# Patient Record
Sex: Female | Born: 1975 | Race: White | Hispanic: No | Smoking: Never smoker
Health system: Southern US, Community
[De-identification: ages and names within clinical notes are randomized; demographics above are authoritative.]

## PROBLEM LIST (undated history)

## (undated) DIAGNOSIS — F329 Major depressive disorder, single episode, unspecified: Secondary | ICD-10-CM

## (undated) DIAGNOSIS — K219 Gastro-esophageal reflux disease without esophagitis: Secondary | ICD-10-CM

## (undated) DIAGNOSIS — F319 Bipolar disorder, unspecified: Secondary | ICD-10-CM

## (undated) DIAGNOSIS — E669 Obesity, unspecified: Secondary | ICD-10-CM

## (undated) DIAGNOSIS — F909 Attention-deficit hyperactivity disorder, unspecified type: Secondary | ICD-10-CM

## (undated) DIAGNOSIS — F32A Depression, unspecified: Secondary | ICD-10-CM

## (undated) DIAGNOSIS — F419 Anxiety disorder, unspecified: Secondary | ICD-10-CM

## (undated) DIAGNOSIS — IMO0001 Reserved for inherently not codable concepts without codable children: Secondary | ICD-10-CM

## (undated) DIAGNOSIS — E079 Disorder of thyroid, unspecified: Secondary | ICD-10-CM

## (undated) HISTORY — DX: Obesity, unspecified: E66.9

## (undated) HISTORY — DX: Depression, unspecified: F32.A

## (undated) HISTORY — DX: Gastro-esophageal reflux disease without esophagitis: K21.9

## (undated) HISTORY — DX: Reserved for inherently not codable concepts without codable children: IMO0001

## (undated) HISTORY — PX: THUMB ARTHROSCOPY: SHX2509

## (undated) HISTORY — DX: Bipolar disorder, unspecified: F31.9

## (undated) HISTORY — DX: Major depressive disorder, single episode, unspecified: F32.9

## (undated) HISTORY — DX: Anxiety disorder, unspecified: F41.9

---

## 2001-09-03 ENCOUNTER — Encounter: Payer: Self-pay | Admitting: Emergency Medicine

## 2001-09-03 ENCOUNTER — Emergency Department (HOSPITAL_COMMUNITY): Admission: EM | Admit: 2001-09-03 | Discharge: 2001-09-03 | Payer: Self-pay | Admitting: Plastic Surgery

## 2001-09-18 ENCOUNTER — Other Ambulatory Visit: Admission: RE | Admit: 2001-09-18 | Discharge: 2001-09-18 | Payer: Self-pay | Admitting: Obstetrics & Gynecology

## 2001-10-29 ENCOUNTER — Emergency Department (HOSPITAL_COMMUNITY): Admission: EM | Admit: 2001-10-29 | Discharge: 2001-10-29 | Payer: Self-pay | Admitting: Emergency Medicine

## 2001-10-29 ENCOUNTER — Encounter: Payer: Self-pay | Admitting: Emergency Medicine

## 2003-08-08 ENCOUNTER — Other Ambulatory Visit: Admission: RE | Admit: 2003-08-08 | Discharge: 2003-08-08 | Payer: Self-pay | Admitting: Obstetrics & Gynecology

## 2004-04-21 ENCOUNTER — Emergency Department (HOSPITAL_COMMUNITY): Admission: EM | Admit: 2004-04-21 | Discharge: 2004-04-21 | Payer: Self-pay | Admitting: Family Medicine

## 2004-09-04 ENCOUNTER — Emergency Department (HOSPITAL_COMMUNITY): Admission: EM | Admit: 2004-09-04 | Discharge: 2004-09-04 | Payer: Self-pay | Admitting: Emergency Medicine

## 2004-11-23 ENCOUNTER — Emergency Department (HOSPITAL_COMMUNITY): Admission: EM | Admit: 2004-11-23 | Discharge: 2004-11-23 | Payer: Self-pay | Admitting: Emergency Medicine

## 2005-10-24 HISTORY — PX: LIGAMENT REPAIR: SHX5444

## 2006-04-13 ENCOUNTER — Emergency Department (HOSPITAL_COMMUNITY): Admission: EM | Admit: 2006-04-13 | Discharge: 2006-04-13 | Payer: Self-pay | Admitting: *Deleted

## 2006-09-06 ENCOUNTER — Ambulatory Visit (HOSPITAL_BASED_OUTPATIENT_CLINIC_OR_DEPARTMENT_OTHER): Admission: RE | Admit: 2006-09-06 | Discharge: 2006-09-06 | Payer: Self-pay | Admitting: Orthopedic Surgery

## 2007-11-26 ENCOUNTER — Emergency Department (HOSPITAL_COMMUNITY): Admission: EM | Admit: 2007-11-26 | Discharge: 2007-11-26 | Payer: Self-pay | Admitting: Family Medicine

## 2009-09-19 ENCOUNTER — Observation Stay (HOSPITAL_COMMUNITY): Admission: EM | Admit: 2009-09-19 | Discharge: 2009-09-19 | Payer: Self-pay | Admitting: Emergency Medicine

## 2009-12-18 ENCOUNTER — Emergency Department (HOSPITAL_COMMUNITY): Admission: EM | Admit: 2009-12-18 | Discharge: 2009-12-18 | Payer: Self-pay | Admitting: Emergency Medicine

## 2011-03-11 NOTE — Op Note (Signed)
Tammie Rodriguez, Tammie Rodriguez               ACCOUNT NO.:  [id number]   MEDICAL RECORD NO.:  [id number]          PATIENT TYPE:  AMB   LOCATION:  DSC                          FACILITY:  [facility]   PHYSICIAN:  Cindee Salt, M.D.       DATE OF BIRTH:  1976-03-15   DATE OF PROCEDURE:  09/06/2006  DATE OF DISCHARGE:                                 OPERATIVE REPORT   PREOPERATIVE DIAGNOSIS:  Tear ulnar collateral ligament, metacarpophalangeal  joint, left thumb.   POSTOPERATIVE DIAGNOSIS:  Tear ulnar collateral ligament,  metacarpophalangeal joint, left thumb.   OPERATION:  Arthroscopy, synovectomy, shrinkage ulnar collateral ligament,  metacarpophalangeal joint, left thumb.   SURGEON:  Cindee Salt, M.D.   ASSISTANT:  Carolyne Fiscal R.N.   ANESTHESIA:  General.   HISTORY:  The patient is a 35 year old female with a history of sprain of  the ulnar collateral ligament, metacarpophalangeal joint, left thumb which  has not responded to conservative treatment.  She is admitted now for  arthroscopy, possible open repair, ulnar collateral ligament,  metacarpophalangeal joint left thumb.  She is aware of risks and  complications including infection, recurrence, incomplete relief of  symptoms, dystrophy.  She is desirous of proceeding.  In the preoperative  area questions were encouraged and answered.  The extremity marked by both  the patient and surgeon.   PROCEDURE:  The patient was placed in the arthroscopy tower after general  anesthetic was carried out.  She was prepped using DuraPrep, supine  position, left arm free, draped in the supine position.  The traction was  placed 5 pounds on the metacarpophalangeal joint.  This was inflated to the  ulnar radial aspect of the extensor tendon.  The tendon portal was opened,  deepened with a hemostat.  The scope was introduced.  This was 1.9 mm scope.  The ulnar portal was then opened, deepened with a hemostat.  The joint was  inspected.  Significant synovitis was  present and 22 gauge needle was placed  through the extensor tendon.  This stabilized and allowed irrigation.  The  joint was fully debrided with an Arthrowand.  Several small loose fragments  were apparent.  The ulnar collateral ligament was found to be intact to its  insertion on the proximal phalanx and this was shrunk with the Arthrowand  being certain that no significant increase in temperature was noted.  The  instruments were removed.  The joint was quite stable.  The portals closed  with interrupted 5-0 nylon sutures.  Sterile compressive dressing, thumb  spica splint applied.  The patient tolerated the procedure well was taken to  the recovery room for observation in satisfactory condition.  She is  discharged home to return to the [facility] in one week on  Vicodin.           ______________________________  Cindee Salt, M.D.    GK/MEDQ  D:  09/06/2006  T:  09/06/2006  Job:  [id number]

## 2011-07-15 LAB — POCT RAPID STREP A: Streptococcus, Group A Screen (Direct): POSITIVE — AB

## 2011-10-29 ENCOUNTER — Ambulatory Visit (INDEPENDENT_AMBULATORY_CARE_PROVIDER_SITE_OTHER): Payer: Commercial Managed Care - PPO

## 2011-10-29 DIAGNOSIS — R5381 Other malaise: Secondary | ICD-10-CM

## 2011-10-29 DIAGNOSIS — R112 Nausea with vomiting, unspecified: Secondary | ICD-10-CM

## 2011-10-29 DIAGNOSIS — J9801 Acute bronchospasm: Secondary | ICD-10-CM

## 2011-11-18 ENCOUNTER — Ambulatory Visit (INDEPENDENT_AMBULATORY_CARE_PROVIDER_SITE_OTHER): Payer: Commercial Managed Care - PPO

## 2011-11-18 DIAGNOSIS — J029 Acute pharyngitis, unspecified: Secondary | ICD-10-CM

## 2011-11-18 DIAGNOSIS — J069 Acute upper respiratory infection, unspecified: Secondary | ICD-10-CM

## 2012-01-03 ENCOUNTER — Telehealth: Payer: Self-pay

## 2012-01-03 ENCOUNTER — Ambulatory Visit (INDEPENDENT_AMBULATORY_CARE_PROVIDER_SITE_OTHER): Payer: Commercial Managed Care - PPO | Admitting: Family Medicine

## 2012-01-03 VITALS — BP 129/84 | HR 103 | Temp 98.0°F | Resp 16

## 2012-01-03 DIAGNOSIS — K219 Gastro-esophageal reflux disease without esophagitis: Secondary | ICD-10-CM | POA: Insufficient documentation

## 2012-01-03 DIAGNOSIS — H538 Other visual disturbances: Secondary | ICD-10-CM

## 2012-01-03 DIAGNOSIS — J029 Acute pharyngitis, unspecified: Secondary | ICD-10-CM

## 2012-01-03 DIAGNOSIS — E039 Hypothyroidism, unspecified: Secondary | ICD-10-CM | POA: Insufficient documentation

## 2012-01-03 LAB — COMPREHENSIVE METABOLIC PANEL
ALT: 14 U/L (ref 0–35)
AST: 17 U/L (ref 0–37)
Albumin: 4.6 g/dL (ref 3.5–5.2)
Alkaline Phosphatase: 76 U/L (ref 39–117)
BUN: 9 mg/dL (ref 6–23)
CO2: 24 mEq/L (ref 19–32)
Calcium: 9.6 mg/dL (ref 8.4–10.5)
Chloride: 103 mEq/L (ref 96–112)
Creat: 0.81 mg/dL (ref 0.50–1.10)
Glucose, Bld: 97 mg/dL (ref 70–99)
Potassium: 4.1 mEq/L (ref 3.5–5.3)
Sodium: 137 mEq/L (ref 135–145)
Total Bilirubin: 0.4 mg/dL (ref 0.3–1.2)
Total Protein: 6.7 g/dL (ref 6.0–8.3)

## 2012-01-03 LAB — POCT CBC
Granulocyte percent: 71.3 %G (ref 37–80)
HCT, POC: 39.4 % (ref 37.7–47.9)
Hemoglobin: 12.6 g/dL (ref 12.2–16.2)
Lymph, poc: 2 (ref 0.6–3.4)
MCH, POC: 30.1 pg (ref 27–31.2)
MCHC: 32 g/dL (ref 31.8–35.4)
MCV: 94 fL (ref 80–97)
MID (cbc): 0.4 (ref 0–0.9)
MPV: 8.1 fL (ref 0–99.8)
POC Granulocyte: 6.1 (ref 2–6.9)
POC LYMPH PERCENT: 24 %L (ref 10–50)
POC MID %: 4.7 %M (ref 0–12)
Platelet Count, POC: 343 10*3/uL (ref 142–424)
RBC: 4.19 M/uL (ref 4.04–5.48)
RDW, POC: 12.6 %
WBC: 8.5 10*3/uL (ref 4.6–10.2)

## 2012-01-03 LAB — POCT RAPID STREP A (OFFICE): Rapid Strep A Screen: NEGATIVE

## 2012-01-03 LAB — POCT URINALYSIS DIPSTICK
Bilirubin, UA: NEGATIVE
Blood, UA: NEGATIVE
Glucose, UA: NEGATIVE
Ketones, UA: NEGATIVE
Leukocytes, UA: NEGATIVE
Nitrite, UA: NEGATIVE
Protein, UA: NEGATIVE
Spec Grav, UA: <=1.005
Urobilinogen, UA: 0.2
pH, UA: 6

## 2012-01-03 LAB — POCT UA - MICROSCOPIC ONLY
Bacteria, U Microscopic: NEGATIVE
Casts, Ur, LPF, POC: NEGATIVE
Crystals, Ur, HPF, POC: NEGATIVE
Epithelial cells, urine per micros: NEGATIVE
Mucus, UA: NEGATIVE
RBC, urine, microscopic: NEGATIVE
WBC, Ur, HPF, POC: NEGATIVE
Yeast, UA: NEGATIVE

## 2012-01-03 LAB — POCT SEDIMENTATION RATE: POCT SED RATE: 31 mm/hr — AB (ref 0–22)

## 2012-01-03 MED ORDER — AZITHROMYCIN 250 MG PO TABS: ORAL_TABLET | ORAL | Status: AC

## 2012-01-03 MED ORDER — PROMETHAZINE HCL 25 MG PO TABS: 25.0000 mg | ORAL_TABLET | Freq: Three times a day (TID) | ORAL | Status: DC | PRN

## 2012-01-03 NOTE — Progress Notes (Signed)
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  Started vomiting one week ago, now intermittent. Developed blurry vision which comes and goes all week, lasting a few minutes at a time, several times during the day.  Some minor headaches but no migraines.  Today developed sore throat, and vision is continuing to be fuzzy off and on.  No abdominal pain.  Slight cough and mild sinus congestion.  A couple episodes of diarrhea.  No diplopia  O:  NAD, appears disaffected Throat: white exudates TM's:  Normal No resp distress Fundi:  Normal Skin:  Warm and dry  Results for orders placed in visit on 01/03/12  POCT RAPID STREP A (OFFICE)      Component Value Range   Rapid Strep A Screen Negative  Negative   POCT CBC      Component Value Range   WBC 8.5  4.6 - 10.2 (K/uL)   Lymph, poc 2.0  0.6 - 3.4    POC LYMPH PERCENT 24.0  10 - 50 (%L)   MID (cbc) 0.4  0 - 0.9    POC MID % 4.7  0 - 12 (%M)   POC Granulocyte 6.1  2 - 6.9    Granulocyte percent 71.3  37 - 80 (%G)   RBC 4.19  4.04 - 5.48 (M/uL)   Hemoglobin 12.6  12.2 - 16.2 (g/dL)   HCT, POC 16.1  09.6 - 47.9 (%)   MCV 94.0  80 - 97 (fL)   MCH, POC 30.1  27 - 31.2 (pg)   MCHC 32.0  31.8 - 35.4 (g/dL)   RDW, POC 04.5     Platelet Count, POC 343  142 - 424 (K/uL)   MPV 8.1  0 - 99.8 (fL)  POCT UA - MICROSCOPIC ONLY      Component Value Range   WBC, Ur, HPF, POC neg     RBC, urine, microscopic neg     Bacteria, U Microscopic neg     Mucus, UA neg     Epithelial cells, urine per micros neg     Crystals, Ur, HPF, POC neg     Casts, Ur, LPF, POC neg     Yeast, UA neg    POCT URINALYSIS DIPSTICK      Component Value Range   Color, UA yellow     Clarity, UA clear     Glucose, UA neg     Bilirubin, UA neg     Ketones, UA neg     Spec Grav, UA <=1.005     Blood, UA neg     pH, UA 6.0     Protein, UA neg     Urobilinogen, UA 0.2     Nitrite, UA neg     Leukocytes, UA Negative     assessment: Blurry vision may be  related to migraine, but patient is currently under great deal of stress. Does appear to have a throat infection and probably has a viral infection causing the vomiting.  Plan: Phenergan for the nausea, Z-Pak for the throat, out of work 2 days.

## 2012-01-03 NOTE — Telephone Encounter (Signed)
Pt seen today - not any better - [phone]

## 2012-01-03 NOTE — Telephone Encounter (Signed)
Spoke with pt she is getting worse. I advised to go to ER but will give Dr L the message. She cannot keep anything down. Advised to RTC or ER if she is not feeling better

## 2012-10-20 DIAGNOSIS — R52 Pain, unspecified: Secondary | ICD-10-CM | POA: Insufficient documentation

## 2012-10-20 DIAGNOSIS — Z79899 Other long term (current) drug therapy: Secondary | ICD-10-CM | POA: Insufficient documentation

## 2012-10-20 DIAGNOSIS — R112 Nausea with vomiting, unspecified: Secondary | ICD-10-CM | POA: Insufficient documentation

## 2012-10-20 DIAGNOSIS — IMO0001 Reserved for inherently not codable concepts without codable children: Secondary | ICD-10-CM | POA: Insufficient documentation

## 2012-10-20 DIAGNOSIS — F909 Attention-deficit hyperactivity disorder, unspecified type: Secondary | ICD-10-CM | POA: Insufficient documentation

## 2012-10-20 DIAGNOSIS — R6883 Chills (without fever): Secondary | ICD-10-CM | POA: Insufficient documentation

## 2012-10-20 DIAGNOSIS — K219 Gastro-esophageal reflux disease without esophagitis: Secondary | ICD-10-CM | POA: Insufficient documentation

## 2012-10-20 DIAGNOSIS — E079 Disorder of thyroid, unspecified: Secondary | ICD-10-CM | POA: Insufficient documentation

## 2012-10-20 DIAGNOSIS — R51 Headache: Secondary | ICD-10-CM | POA: Insufficient documentation

## 2012-10-21 ENCOUNTER — Encounter (HOSPITAL_COMMUNITY): Payer: Self-pay | Admitting: *Deleted

## 2012-10-21 ENCOUNTER — Emergency Department (HOSPITAL_COMMUNITY)
Admission: EM | Admit: 2012-10-21 | Discharge: 2012-10-21 | Disposition: A | Discharge status: Home / Self Care | Payer: 59 | Attending: Emergency Medicine | Admitting: Emergency Medicine

## 2012-10-21 DIAGNOSIS — R112 Nausea with vomiting, unspecified: Secondary | ICD-10-CM

## 2012-10-21 HISTORY — DX: Attention-deficit hyperactivity disorder, unspecified type: F90.9

## 2012-10-21 HISTORY — DX: Disorder of thyroid, unspecified: E07.9

## 2012-10-21 HISTORY — DX: Gastro-esophageal reflux disease without esophagitis: K21.9

## 2012-10-21 LAB — CBC WITH DIFFERENTIAL/PLATELET
Basophils Absolute: 0.1 10*3/uL (ref 0.0–0.1)
Basophils Relative: 1 % (ref 0–1)
Eosinophils Absolute: 0.2 10*3/uL (ref 0.0–0.7)
Hemoglobin: 12.1 g/dL (ref 12.0–15.0)
MCHC: 32.5 g/dL (ref 30.0–36.0)
Monocytes Relative: 8 % (ref 3–12)
Neutro Abs: 4.3 10*3/uL (ref 1.7–7.7)
Neutrophils Relative %: 53 % (ref 43–77)
Platelets: 326 10*3/uL (ref 150–400)
RDW: 13.3 % (ref 11.5–15.5)

## 2012-10-21 LAB — POCT I-STAT, CHEM 8
Chloride: 103 mEq/L (ref 96–112)
HCT: 39 % (ref 36.0–46.0)
Hemoglobin: 13.3 g/dL (ref 12.0–15.0)
Potassium: 3.6 mEq/L (ref 3.5–5.1)
Sodium: 140 mEq/L (ref 135–145)

## 2012-10-21 MED ORDER — SODIUM CHLORIDE 0.9 % IV BOLUS (SEPSIS)
1000.0000 mL | Freq: Once | INTRAVENOUS | Status: AC
  Administered 2012-10-21: 1000 mL via INTRAVENOUS

## 2012-10-21 MED ORDER — ONDANSETRON HCL 4 MG/2ML IJ SOLN
4.0000 mg | Freq: Once | INTRAMUSCULAR | Status: AC
  Administered 2012-10-21: 4 mg via INTRAVENOUS
  Filled 2012-10-21: qty 2

## 2012-10-21 MED ORDER — ONDANSETRON HCL 4 MG PO TABS: 4.0000 mg | ORAL_TABLET | Freq: Four times a day (QID) | ORAL | Status: DC

## 2012-10-21 NOTE — ED Notes (Signed)
Pt has drink/PO challenge with no s/s of any vomiting observed or reported. Will continue to monitor pt.

## 2012-10-21 NOTE — ED Provider Notes (Signed)
History     CSN: [id number]  Arrival date & time 10/20/12  2317   First MD Initiated Contact with Patient 10/21/12 0019      Chief Complaint  Patient presents with  . Headache  . Emesis  . Chills  . Generalized Body Aches    (Consider location/radiation/quality/duration/timing/severity/associated sxs/prior treatment) HPI Comments: Patient with N/V generalized myalgia and headache for the past several hours Denies diarrhea   Patient is a 36 y.o. female presenting with headaches and vomiting. The history is provided by the patient.  Headache  This is a new problem. The current episode started 6 to 12 hours ago. The problem occurs every few hours. The problem has not changed since onset.The headache is associated with nothing. Associated symptoms include nausea. Pertinent negatives include no fever, no shortness of breath and no vomiting.  Emesis  Associated symptoms include chills and headaches. Pertinent negatives include no abdominal pain and no fever.    Past Medical History  Diagnosis Date  . Thyroid disease   . ADHD (attention deficit hyperactivity disorder)   . Acid reflux     Past Surgical History  Procedure Date  . Thumb arthroscopy     No family history on file.  History  Substance Use Topics  . Smoking status: Never Smoker   . Smokeless tobacco: Not on file  . Alcohol Use: Yes    OB History    Grav Para Term Preterm Abortions TAB SAB Ect Mult Living                  Review of Systems  Constitutional: Positive for chills. Negative for fever.  HENT: Negative for sore throat, rhinorrhea, trouble swallowing, neck pain and sinus pressure.   Respiratory: Negative for shortness of breath.   Cardiovascular: Negative for chest pain.  Gastrointestinal: Positive for nausea. Negative for vomiting and abdominal pain.  Skin: Negative for rash and wound.  Neurological: Positive for headaches.    Allergies  Amoxicillin and Penicillins  Home Medications    Current Outpatient Rx  Name  Route  Sig  Dispense  Refill  . LAMOTRIGINE 100 MG PO TABS   Oral   Take 100 mg by mouth at bedtime.         Marland Kitchen LEVOTHYROXINE SODIUM 100 MCG PO TABS   Oral   Take 100 mcg by mouth daily.         Marland Kitchen LISDEXAMFETAMINE DIMESYLATE 70 MG PO CAPS   Oral   Take 70 mg by mouth daily.         Marland Kitchen OMEPRAZOLE 20 MG PO CPDR   Oral   Take 20 mg by mouth daily.         Marland Kitchen ZOLPIDEM TARTRATE 10 MG PO TABS   Oral   Take 10 mg by mouth at bedtime as needed. sleep         . ONDANSETRON HCL 4 MG PO TABS   Oral   Take 1 tablet (4 mg total) by mouth every 6 (six) hours.   12 tablet   0     BP 131/88  Pulse 105  Temp 98.3 F (36.8 C) (Oral)  Resp 18  SpO2 99%  LMP 10/07/2012  Physical Exam  Constitutional: She appears well-developed and well-nourished.  HENT:  Head: Normocephalic and atraumatic.  Eyes: Pupils are equal, round, and reactive to light.  Neck: Normal range of motion.  Cardiovascular: Normal rate.   Pulmonary/Chest: Effort normal.  Abdominal: Soft. Bowel sounds are normal.  She exhibits no distension. There is generalized tenderness.  Musculoskeletal: Normal range of motion.  Neurological: She is alert.  Skin: Skin is warm and dry. There is pallor.    ED Course  Procedures (including critical care time)   Labs Reviewed  CBC WITH DIFFERENTIAL  POCT I-STAT, CHEM 8   No results found.   1. Nausea & vomiting       MDM   Will check CBC I-Stat  Hydrate control nausea  Tolerating PO        Arman Filter, NP 10/21/12 0201  Arman Filter, NP 10/21/12 0201  Arman Filter, NP 10/21/12 0201
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
## 2012-10-21 NOTE — ED Notes (Signed)
Pt denies any questions upon discharge. 

## 2012-10-21 NOTE — ED Provider Notes (Signed)
Medical screening examination/treatment/procedure(s) were performed by non-physician practitioner and as supervising physician I was immediately available for consultation/collaboration.  John-Adam Telecia Larocque, M.D.     John-Adam Malaika Arnall, MD 10/21/12 0843 

## 2012-12-01 ENCOUNTER — Emergency Department (HOSPITAL_COMMUNITY): Payer: Worker's Compensation

## 2012-12-01 ENCOUNTER — Encounter (HOSPITAL_COMMUNITY): Payer: Self-pay | Admitting: Emergency Medicine

## 2012-12-01 ENCOUNTER — Emergency Department (HOSPITAL_COMMUNITY)
Admission: EM | Admit: 2012-12-01 | Discharge: 2012-12-01 | Disposition: A | Discharge status: Home / Self Care | Payer: Worker's Compensation | Attending: Emergency Medicine | Admitting: Emergency Medicine

## 2012-12-01 DIAGNOSIS — E079 Disorder of thyroid, unspecified: Secondary | ICD-10-CM | POA: Insufficient documentation

## 2012-12-01 DIAGNOSIS — F909 Attention-deficit hyperactivity disorder, unspecified type: Secondary | ICD-10-CM | POA: Insufficient documentation

## 2012-12-01 DIAGNOSIS — Z79899 Other long term (current) drug therapy: Secondary | ICD-10-CM | POA: Insufficient documentation

## 2012-12-01 DIAGNOSIS — IMO0002 Reserved for concepts with insufficient information to code with codable children: Secondary | ICD-10-CM | POA: Insufficient documentation

## 2012-12-01 DIAGNOSIS — Y990 Civilian activity done for income or pay: Secondary | ICD-10-CM | POA: Insufficient documentation

## 2012-12-01 DIAGNOSIS — K219 Gastro-esophageal reflux disease without esophagitis: Secondary | ICD-10-CM | POA: Insufficient documentation

## 2012-12-01 DIAGNOSIS — R42 Dizziness and giddiness: Secondary | ICD-10-CM | POA: Insufficient documentation

## 2012-12-01 DIAGNOSIS — Y93I9 Activity, other involving external motion: Secondary | ICD-10-CM | POA: Insufficient documentation

## 2012-12-01 DIAGNOSIS — S0990XA Unspecified injury of head, initial encounter: Secondary | ICD-10-CM | POA: Insufficient documentation

## 2012-12-01 DIAGNOSIS — R51 Headache: Secondary | ICD-10-CM | POA: Insufficient documentation

## 2012-12-01 DIAGNOSIS — Y92410 Unspecified street and highway as the place of occurrence of the external cause: Secondary | ICD-10-CM | POA: Insufficient documentation

## 2012-12-01 MED ORDER — OXYCODONE-ACETAMINOPHEN 5-325 MG PO TABS: 2.0000 | ORAL_TABLET | ORAL | Status: DC | PRN

## 2012-12-01 MED ORDER — OXYCODONE-ACETAMINOPHEN 5-325 MG PO TABS
2.0000 | ORAL_TABLET | Freq: Once | ORAL | Status: AC
  Administered 2012-12-01: 2 via ORAL
  Filled 2012-12-01: qty 2
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
## 2012-12-03 NOTE — ED Provider Notes (Signed)
Medical screening examination/treatment/procedure(s) were performed by non-physician practitioner and as supervising physician I was immediately available for consultation/collaboration.  Ethelle Ola K Kace Hartje-Rasch, MD 12/03/12 2300 

## 2012-12-03 NOTE — ED Provider Notes (Signed)
Medical screening examination/treatment/procedure(s) were performed by non-physician practitioner and as supervising physician I was immediately available for consultation/collaboration.  Jasmine Awe, MD 12/03/12 2259

## 2013-02-05 ENCOUNTER — Telehealth: Payer: Self-pay | Admitting: Nurse Practitioner

## 2013-02-05 NOTE — Telephone Encounter (Signed)
Pt off vyvance wants to know if she can restart weight loss med she use to take. Call into [facility] [place] [phone] if you approve

## 2013-02-05 NOTE — Telephone Encounter (Signed)
Pt would like to speak to you regarding her medicine when you get a moment to give her a call or does she need to come in? She has been off her Vyvance for about a month and re her weight loss meds.

## 2013-02-06 ENCOUNTER — Other Ambulatory Visit: Payer: Self-pay | Admitting: Nurse Practitioner

## 2013-02-06 ENCOUNTER — Other Ambulatory Visit: Payer: Self-pay | Admitting: Family Medicine

## 2013-02-06 ENCOUNTER — Telehealth: Payer: Self-pay | Admitting: Nurse Practitioner

## 2013-02-06 MED ORDER — LISDEXAMFETAMINE DIMESYLATE 70 MG PO CAPS: 70.0000 mg | ORAL_CAPSULE | ORAL | Status: DC

## 2013-02-06 MED ORDER — PHENTERMINE HCL 37.5 MG PO CAPS: 37.5000 mg | ORAL_CAPSULE | ORAL | Status: DC

## 2013-02-06 NOTE — Telephone Encounter (Signed)
Vyvanse Rx cancelled.  Will restart Phentermine. Has taken this without difficulty in past.  OV in 3 months if she wishes to continue.

## 2013-02-06 NOTE — Telephone Encounter (Signed)
Given one month Rx for Vyvanse.  Recommend office visit within one month.

## 2013-02-06 NOTE — Telephone Encounter (Signed)
rx for phentermine not vyvance called in for the pt per carolyn. Pt notified on her voicemail.

## 2013-02-06 NOTE — Telephone Encounter (Signed)
Will give one month Rx for Vyvanse.  Needs office visit.  Last one was in October.

## 2013-02-25 ENCOUNTER — Other Ambulatory Visit: Payer: Self-pay | Admitting: Nurse Practitioner

## 2013-02-26 NOTE — Telephone Encounter (Signed)
Ok wis with three ref

## 2013-03-12 ENCOUNTER — Ambulatory Visit (INDEPENDENT_AMBULATORY_CARE_PROVIDER_SITE_OTHER): Payer: 59 | Admitting: Nurse Practitioner

## 2013-03-12 ENCOUNTER — Encounter: Payer: Self-pay | Admitting: Nurse Practitioner

## 2013-03-12 VITALS — BP 130/80 | HR 70 | Ht 69.0 in | Wt 227.0 lb

## 2013-03-12 DIAGNOSIS — F329 Major depressive disorder, single episode, unspecified: Secondary | ICD-10-CM

## 2013-03-12 DIAGNOSIS — E039 Hypothyroidism, unspecified: Secondary | ICD-10-CM

## 2013-03-12 DIAGNOSIS — F5104 Psychophysiologic insomnia: Secondary | ICD-10-CM

## 2013-03-12 DIAGNOSIS — G4700 Insomnia, unspecified: Secondary | ICD-10-CM

## 2013-03-12 DIAGNOSIS — N920 Excessive and frequent menstruation with regular cycle: Secondary | ICD-10-CM

## 2013-03-12 LAB — POCT HEMOGLOBIN: Hemoglobin: 12.9 g/dL (ref 12.2–16.2)

## 2013-03-12 MED ORDER — ZOLPIDEM TARTRATE 10 MG PO TABS: ORAL_TABLET | ORAL | Status: DC

## 2013-03-12 MED ORDER — BUPROPION HCL ER (XL) 150 MG PO TB24: 150.0000 mg | ORAL_TABLET | Freq: Every day | ORAL | Status: DC

## 2013-03-12 MED ORDER — PHENTERMINE HCL 37.5 MG PO CAPS: 37.5000 mg | ORAL_CAPSULE | ORAL | Status: DC

## 2013-03-12 NOTE — Patient Instructions (Addendum)
Sleeve gastrectomy.  [facility] or [facility]

## 2013-03-14 ENCOUNTER — Encounter: Payer: Self-pay | Admitting: Nurse Practitioner

## 2013-03-14 DIAGNOSIS — F5104 Psychophysiologic insomnia: Secondary | ICD-10-CM | POA: Insufficient documentation

## 2013-03-14 DIAGNOSIS — F329 Major depressive disorder, single episode, unspecified: Secondary | ICD-10-CM | POA: Insufficient documentation

## 2013-03-14 NOTE — Progress Notes (Signed)
Subjective:  Presents to discuss her weight. Working on regular basis. Has been off her phentermine for her 6 months, started back on 4/16. Denies any adverse affects. Doing better with her diet. Is receiving regular mental health counseling. Has a lot of stress at this point. Compliant with medications. Would like to restart her Wellbutrin. Continues to have problems with insomnia. Denies suicidal or homicidal thoughts or ideation. Strong family history of obesity. Also having regular cycles, extremely heavy flow. Has been considering intervention for this. Last routine blood work July 2013.  Objective:   BP 130/80  Pulse 70  Ht 5\' 9"  (1.753 m)  Wt 227 lb (102.967 kg)  BMI 33.51 kg/m2 NAD. Alert, oriented. Lungs clear. Heart regular rate rhythm. Thyroid normal to palpation and nontender. Mildly anxious affect.  Assessment:  Menorrhagia - Plan: POCT hemoglobin  Hypothyroidism  Chronic insomnia  Depression  Plan: Meds ordered this encounter  Medications  . levothyroxine (SYNTHROID, LEVOTHROID) 137 MCG tablet    Sig: Take 137 mcg by mouth daily before breakfast.  . diclofenac (VOLTAREN) 75 MG EC tablet    Sig: Take 75 mg by mouth 2 (two) times daily.  . phentermine 37.5 MG capsule    Sig: Take 1 capsule (37.5 mg total) by mouth every morning.    Dispense:  30 capsule    Refill:  2    Order Specific Question:  Supervising Provider    Answer:  Merlyn Albert [[id number]]  . zolpidem (AMBIEN) 10 MG tablet    Sig: TAKE 1 TABLET BY MOUTH EVERY NIGHT AT BEDTIME AS NEEDED FOR SLEEP    Dispense:  30 tablet    Refill:  2    Order Specific Question:  Supervising Provider    Answer:  Merlyn Albert [[id number]]  . buPROPion (WELLBUTRIN XL) 150 MG 24 hr tablet    Sig: Take 1 tablet (150 mg total) by mouth daily.    Dispense:  30 tablet    Refill:  2    Order Specific Question:  Supervising Provider    Answer:  Merlyn Albert [[id number]]   recheck in 3 months, call back sooner if any problems.  Advised patient to take her phentermine early in the morning to avoid worsening insomnia.

## 2013-03-14 NOTE — Assessment & Plan Note (Signed)
Continue Ambien as directed.

## 2013-03-14 NOTE — Assessment & Plan Note (Signed)
Continue Lamictal as directed. Add Wellbutrin as directed. Recheck in 3 months.

## 2013-03-28 ENCOUNTER — Telehealth: Payer: Self-pay | Admitting: Nurse Practitioner

## 2013-03-28 ENCOUNTER — Other Ambulatory Visit: Payer: Self-pay | Admitting: Nurse Practitioner

## 2013-03-28 MED ORDER — BUPROPION HCL ER (XL) 300 MG PO TB24: 300.0000 mg | ORAL_TABLET | Freq: Every day | ORAL | Status: DC

## 2013-03-28 NOTE — Telephone Encounter (Signed)
Patient would like to increase her Wellbutrin. Please advise.

## 2013-03-28 NOTE — Telephone Encounter (Signed)
New Rx for Wellbutrin XL 300 mg sent in. Call back if any problems.

## 2013-03-29 NOTE — Telephone Encounter (Signed)
Patient already picked up medication.

## 2013-04-05 ENCOUNTER — Encounter: Payer: Self-pay | Admitting: *Deleted

## 2013-04-11 ENCOUNTER — Ambulatory Visit (INDEPENDENT_AMBULATORY_CARE_PROVIDER_SITE_OTHER): Payer: 59 | Admitting: Nurse Practitioner

## 2013-04-11 ENCOUNTER — Encounter: Payer: Self-pay | Admitting: Nurse Practitioner

## 2013-04-11 VITALS — BP 124/94 | HR 80 | Wt 228.0 lb

## 2013-04-11 DIAGNOSIS — F988 Other specified behavioral and emotional disorders with onset usually occurring in childhood and adolescence: Secondary | ICD-10-CM

## 2013-04-11 DIAGNOSIS — R0683 Snoring: Secondary | ICD-10-CM

## 2013-04-11 DIAGNOSIS — R5383 Other fatigue: Secondary | ICD-10-CM

## 2013-04-11 DIAGNOSIS — R5381 Other malaise: Secondary | ICD-10-CM

## 2013-04-11 DIAGNOSIS — R0989 Other specified symptoms and signs involving the circulatory and respiratory systems: Secondary | ICD-10-CM

## 2013-04-11 MED ORDER — LISDEXAMFETAMINE DIMESYLATE 70 MG PO CAPS: 70.0000 mg | ORAL_CAPSULE | ORAL | Status: DC

## 2013-04-12 ENCOUNTER — Encounter: Payer: Self-pay | Admitting: Nurse Practitioner

## 2013-04-12 DIAGNOSIS — F988 Other specified behavioral and emotional disorders with onset usually occurring in childhood and adolescence: Secondary | ICD-10-CM | POA: Insufficient documentation

## 2013-04-12 NOTE — Progress Notes (Signed)
Subjective:  Presents for recheck of her ADD. Patient had recently stopped this medication. 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 Has found she does need to Vyvanse to help her focus and concentrate. Has stopped her phentermine. Also has extreme fatigue. Difficulty losing weight. Loud snoring at nighttime. Patient's top weight was over 280. Has not been able to get below 210 pounds despite regular exercise. Is looking into possible bariatric surgery.  Objective:   BP 124/94  Pulse 80  Wt 228 lb (103.42 kg)  BMI 33.65 kg/m2  LMP 04/01/2009 NAD. Alert, oriented. Lungs clear. Heart regular rate rhythm. Large waist circumference with some central obesity.  Assessment:ADD (attention deficit disorder)  Morbid obesity  Other malaise and fatigue  Snoring  Plan: Given 3 monthly prescriptions for Vyvanse 30 mg daily. Encouraged continued activity. Will work on setting up a home based sleep study to rule out sleep apnea. Continue other medications as directed. Recheck in 3 months, call back sooner if any problems. Patient advised against pregnancy with her current medication regimen. Wants to have bariatric surgery before pregnancy, reminded patient that it is usually advised to wait 2 years after surgery before attempting conception.

## 2013-04-12 NOTE — Assessment & Plan Note (Signed)
Restart Vyvanse 70 mg daily.

## 2013-04-12 NOTE — Assessment & Plan Note (Signed)
Patient is exploring option of bariatric surgery.

## 2013-04-23 ENCOUNTER — Encounter (INDEPENDENT_AMBULATORY_CARE_PROVIDER_SITE_OTHER): Payer: Self-pay | Admitting: General Surgery

## 2013-04-23 ENCOUNTER — Ambulatory Visit (INDEPENDENT_AMBULATORY_CARE_PROVIDER_SITE_OTHER): Payer: 59 | Admitting: General Surgery

## 2013-04-23 DIAGNOSIS — F329 Major depressive disorder, single episode, unspecified: Secondary | ICD-10-CM

## 2013-04-23 DIAGNOSIS — Z6834 Body mass index (BMI) 34.0-34.9, adult: Secondary | ICD-10-CM

## 2013-04-23 NOTE — Patient Instructions (Addendum)
Call and ask for me if there are any communication issues with my office
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
## 2013-04-30 ENCOUNTER — Telehealth (INDEPENDENT_AMBULATORY_CARE_PROVIDER_SITE_OTHER): Payer: Self-pay | Admitting: General Surgery

## 2013-04-30 NOTE — Telephone Encounter (Signed)
Message copied by June Leap on Tue Apr 30, 2013  4:33 PM ------      Message from: Andrey Campanile, ERIC M      Created: Thu Apr 25, 2013 12:26 PM       Will we need to get the results of this pt's in-home sleep study from her PCP as part of her bariatric workup            Thanks      wilson ------

## 2013-04-30 NOTE — Telephone Encounter (Signed)
This was given to [employer] (new bari coord) and she states that she will take care of it

## 2013-05-10 ENCOUNTER — Encounter: Payer: Self-pay | Admitting: Family Medicine

## 2013-05-14 ENCOUNTER — Ambulatory Visit (INDEPENDENT_AMBULATORY_CARE_PROVIDER_SITE_OTHER): Payer: 59 | Admitting: Family Medicine

## 2013-05-14 VITALS — BP 140/88 | HR 90 | Temp 97.8°F | Resp 18 | Wt 224.0 lb

## 2013-05-14 DIAGNOSIS — K5289 Other specified noninfective gastroenteritis and colitis: Secondary | ICD-10-CM

## 2013-05-14 DIAGNOSIS — K529 Noninfective gastroenteritis and colitis, unspecified: Secondary | ICD-10-CM

## 2013-05-14 DIAGNOSIS — R1110 Vomiting, unspecified: Secondary | ICD-10-CM

## 2013-05-14 LAB — POCT URINALYSIS DIPSTICK
Blood, UA: NEGATIVE
Glucose, UA: NEGATIVE
Nitrite, UA: NEGATIVE
Protein, UA: NEGATIVE
Urobilinogen, UA: 0.2

## 2013-05-14 LAB — POCT UA - MICROSCOPIC ONLY
Casts, Ur, LPF, POC: NEGATIVE
Crystals, Ur, HPF, POC: NEGATIVE
Mucus, UA: NEGATIVE
Yeast, UA: NEGATIVE

## 2013-05-14 LAB — POCT CBC
Granulocyte percent: 58.9 %G (ref 37–80)
MCH, POC: 31.3 pg — AB (ref 27–31.2)
MCV: 98.5 fL — AB (ref 80–97)
MID (cbc): 0.5 (ref 0–0.9)
MPV: 8.9 fL (ref 0–99.8)
POC LYMPH PERCENT: 34.8 %L (ref 10–50)
POC MID %: 6.3 %M (ref 0–12)
Platelet Count, POC: 396 10*3/uL (ref 142–424)
RBC: 4.31 M/uL (ref 4.04–5.48)
RDW, POC: 13.4 %

## 2013-05-14 MED ORDER — PROMETHAZINE HCL 25 MG PO TABS: 25.0000 mg | ORAL_TABLET | Freq: Three times a day (TID) | ORAL | Status: DC | PRN

## 2013-05-14 MED ORDER — KETOROLAC TROMETHAMINE 30 MG/ML IJ SOLN
30.0000 mg | Freq: Once | INTRAMUSCULAR | Status: AC
  Administered 2013-05-14: 30 mg via INTRAMUSCULAR

## 2013-05-14 MED ORDER — ONDANSETRON 4 MG PO TBDP
4.0000 mg | ORAL_TABLET | Freq: Once | ORAL | Status: AC
  Administered 2013-05-14: 4 mg via ORAL

## 2013-05-14 NOTE — Patient Instructions (Signed)
Viral Gastroenteritis Viral gastroenteritis is also known as stomach flu. This condition affects the stomach and intestinal tract. It can cause sudden diarrhea and vomiting. The illness typically lasts 3 to 8 days. Most people develop an immune response that eventually gets rid of the virus. While this natural response develops, the virus can make you quite ill. CAUSES  Many different viruses can cause gastroenteritis, such as rotavirus or noroviruses. You can catch one of these viruses by consuming contaminated food or water. You may also catch a virus by sharing utensils or other personal items with an infected person or by touching a contaminated surface. SYMPTOMS  The most common symptoms are diarrhea and vomiting. These problems can cause a severe loss of body fluids (dehydration) and a body salt (electrolyte) imbalance. Other symptoms may include:  Fever.  Headache.  Fatigue.  Abdominal pain. DIAGNOSIS  Your caregiver can usually diagnose viral gastroenteritis based on your symptoms and a physical exam. A stool sample may also be taken to test for the presence of viruses or other infections. TREATMENT  This illness typically goes away on its own. Treatments are aimed at rehydration. The most serious cases of viral gastroenteritis involve vomiting so severely that you are not able to keep fluids down. In these cases, fluids must be given through an intravenous line (IV). HOME CARE INSTRUCTIONS   Drink enough fluids to keep your urine clear or pale yellow. Drink small amounts of fluids frequently and increase the amounts as tolerated.  Ask your caregiver for specific rehydration instructions.  Avoid:  Foods high in sugar.  Alcohol.  Carbonated drinks.  Tobacco.  Juice.  Caffeine drinks.  Extremely hot or cold fluids.  Fatty, greasy foods.  Too much intake of anything at one time.  Dairy products until 24 to 48 hours after diarrhea stops.  You may consume probiotics.  Probiotics are active cultures of beneficial bacteria. They may lessen the amount and number of diarrheal stools in adults. Probiotics can be found in yogurt with active cultures and in supplements.  Wash your hands well to avoid spreading the virus.  Only take over-the-counter or prescription medicines for pain, discomfort, or fever as directed by your caregiver. Do not give aspirin to children. Antidiarrheal medicines are not recommended.  Ask your caregiver if you should continue to take your regular prescribed and over-the-counter medicines.  Keep all follow-up appointments as directed by your caregiver. SEEK IMMEDIATE MEDICAL CARE IF:   You are unable to keep fluids down.  You do not urinate at least once every 6 to 8 hours.  You develop shortness of breath.  You notice blood in your stool or vomit. This may look like coffee grounds.  You have abdominal pain that increases or is concentrated in one small area (localized).  You have persistent vomiting or diarrhea.  You have a fever.  The patient is a child younger than 3 months, and he or she has a fever.  The patient is a child older than 3 months, and he or she has a fever and persistent symptoms.  The patient is a child older than 3 months, and he or she has a fever and symptoms suddenly get worse.  The patient is a baby, and he or she has no tears when crying. MAKE SURE YOU:   Understand these instructions.  Will watch your condition.  Will get help right away if you are not doing well or get worse. Document Released: 10/10/2005 Document Revised: 01/02/2012 Document Reviewed: 07/27/2011   Exit[employer] Patient Information 2014 [employer].  

## 2013-05-14 NOTE — Progress Notes (Signed)
Subjective:    Patient ID: Tammie Rodriguez, female    DOB: 1976/07/26, 37 y.o.   MRN: [id number] Chief Complaint  Patient presents with  . Emesis  . Diarrhea    last 3 days    HPI  Started feeling ill 3d ago - [place] the beach when it began.  Then 2d ago started with diarrhea, then yesterday started vomiting as well as diarrhea, which has persisted into today.  Has had some chills, no fevers.   Does not know if she has any sick contacts but doesn't think so. Using imodium, gingerale all without any relief.   Feels very lightheaded. No HA.  Last night she went to eat for her birthday - tried a salad but couldn't eat it - no appetite.  Did have 4 beers over a few hours.  This morning she tried to eat a chocolate-covered strawberry and tried a mello-yellow zero and some water but vomited it up 30 min later.   Had an episode of diarrhea around the same time. No dysuria but urine decreased.  Back and neck stiff but not to painful.  Abd pain periumbilical. No chance of pregnancy, no sig vaginal d/c.  In a monogamous relationship and rarely sexually active so declines pelvic exam today. Has annual pelvic w/ PCP in sev wks.  Works as a Engineer, drilling.  Past Medical History  Diagnosis Date  . Thyroid disease   . ADHD (attention deficit hyperactivity disorder)   . Acid reflux   . Depression   . Anxiety   . Bipolar disorder   . Obesity   . Reflux     Past Surgical History  Procedure Laterality Date  . Thumb arthroscopy     Current Outpatient Prescriptions on File Prior to Visit  Medication Sig Dispense Refill  . buPROPion (WELLBUTRIN XL) 300 MG 24 hr tablet Take 1 tablet (300 mg total) by mouth daily.  30 tablet  2  . lamoTRIgine (LAMICTAL) 100 MG tablet Take 100 mg by mouth at bedtime.      Marland Kitchen levothyroxine (SYNTHROID, LEVOTHROID) 137 MCG tablet Take 137 mcg by mouth daily before breakfast.      . lisdexamfetamine (VYVANSE) 70 MG capsule Take 1 capsule (70 mg total) by mouth every morning.  30 capsule  0   . omeprazole (PRILOSEC) 20 MG capsule Take 20 mg by mouth daily as needed.       . zolpidem (AMBIEN) 10 MG tablet TAKE 1 TABLET BY MOUTH EVERY NIGHT AT BEDTIME AS NEEDED FOR SLEEP  30 tablet  2   No current facility-administered medications on file prior to visit.     History   Social History  . Marital Status: Divorced    Spouse Name: N/A    Number of Children: N/A  . Years of Education: N/A   Social History Main Topics  . Smoking status: Never Smoker   . Smokeless tobacco: None  . Alcohol Use: Yes  . Drug Use: No  . Sexually Active: Yes    Birth Control/ Protection: None   Other Topics Concern  . None   Social History Narrative  . None       Review of Systems  Constitutional: Positive for activity change, appetite change and fatigue. Negative for fever, chills and unexpected weight change.  Respiratory: Negative for shortness of breath.   Cardiovascular: Negative for chest pain and leg swelling.  Gastrointestinal: Positive for nausea, vomiting, abdominal pain and diarrhea. Negative for constipation, blood in stool and abdominal  distention.  Genitourinary: Positive for decreased urine volume. Negative for dysuria, vaginal discharge and difficulty urinating.  Musculoskeletal: Negative for gait problem.  Skin: Negative for rash.  Neurological: Positive for dizziness, weakness and light-headedness. Negative for syncope and headaches.  Hematological: Negative for adenopathy.  Psychiatric/Behavioral: Negative for sleep disturbance.      BP 140/88  Pulse 90  Temp(Src) 97.8 F (36.6 C) (Oral)  Resp 18  Wt 224 lb (101.606 kg)  BMI 33.56 kg/m2  SpO2 100%  LMP 05/07/2013 Objective:   Physical Exam  Constitutional: She is oriented to person, place, and time. She appears well-developed and well-nourished. No distress.  HENT:  Head: Normocephalic and atraumatic.  Neck: Normal range of motion. Neck supple. No thyromegaly present.  Cardiovascular: Normal rate, regular  rhythm, normal heart sounds and intact distal pulses.   Pulmonary/Chest: Effort normal and breath sounds normal. No respiratory distress.  Abdominal: Soft. Normal appearance. Bowel sounds are decreased. There is no hepatosplenomegaly. There is tenderness in the right upper quadrant, right lower quadrant and suprapubic area. There is positive Murphy's sign. There is no rigidity, no rebound and no guarding.  Musculoskeletal: She exhibits no edema.  Lymphadenopathy:    She has no cervical adenopathy.  Neurological: She is alert and oriented to person, place, and time.  Skin: Skin is warm and dry. She is not diaphoretic. No erythema.  Psychiatric: She has a normal mood and affect. Her behavior is normal.      Results for orders placed in visit on 05/14/13  POCT CBC      Result Value Range   WBC 7.4  4.6 - 10.2 K/uL   Lymph, poc 2.6  0.6 - 3.4   POC LYMPH PERCENT 34.8  10 - 50 %L   MID (cbc) 0.5  0 - 0.9   POC MID % 6.3  0 - 12 %M   POC Granulocyte 4.4  2 - 6.9   Granulocyte percent 58.9  37 - 80 %G   RBC 4.31  4.04 - 5.48 M/uL   Hemoglobin 13.5  12.2 - 16.2 g/dL   HCT, POC 16.1  09.6 - 47.9 %   MCV 98.5 (*) 80 - 97 fL   MCH, POC 31.3 (*) 27 - 31.2 pg   MCHC 31.8  31.8 - 35.4 g/dL   RDW, POC 04.5     Platelet Count, POC 396  142 - 424 K/uL   MPV 8.9  0 - 99.8 fL  POCT UA - MICROSCOPIC ONLY      Result Value Range   WBC, Ur, HPF, POC neg     RBC, urine, microscopic neg     Bacteria, U Microscopic neg     Mucus, UA neg     Epithelial cells, urine per micros neg     Crystals, Ur, HPF, POC neg     Casts, Ur, LPF, POC neg     Yeast, UA neg    POCT URINALYSIS DIPSTICK      Result Value Range   Color, UA yellow     Clarity, UA clear     Glucose, UA neg     Bilirubin, UA neg     Ketones, UA neg     Spec Grav, UA 1.010     Blood, UA neg     pH, UA 6.0     Protein, UA neg     Urobilinogen, UA 0.2     Nitrite, UA neg     Leukocytes, UA Negative  Assessment & Plan:   Vomiting - Plan: POCT CBC, POCT UA - Microscopic Only, POCT urinalysis dipstick, ketorolac (TORADOL) 30 MG/ML injection 30 mg, ondansetron (ZOFRAN-ODT) disintegrating tablet 4 mg, Comprehensive metabolic panel, Lipase  Noninfectious gastroenteritis and colitis  Meds ordered this encounter  Medications  . ketorolac (TORADOL) 30 MG/ML injection 30 mg    Sig:   . ondansetron (ZOFRAN-ODT) disintegrating tablet 4 mg    Sig:   . promethazine (PHENERGAN) 25 MG tablet    Sig: Take 1 tablet (25 mg total) by mouth every 8 (eight) hours as needed for nausea.    Dispense:  20 tablet    Refill:  0   Given 3L NS IVF in office. Pt felt MUCH better after the zofran and toradol, after 1 L NS IVF her exam improved substantially with normal bowel sounds and negative Murphy's sign but cont to have ttp in bilateral LQ and suprapubic area but w/o guarding or rebound.  Was able to tolerate clear liquids while in office so rec sxs care w/ rest and push fluids for next 2d. RTC if sxs recur or worsen or are not resolved w/in 48 hrs.

## 2013-05-15 LAB — COMPREHENSIVE METABOLIC PANEL
AST: 17 U/L (ref 0–37)
Albumin: 4.7 g/dL (ref 3.5–5.2)
Alkaline Phosphatase: 90 U/L (ref 39–117)
BUN: 6 mg/dL (ref 6–23)
Potassium: 4.2 mEq/L (ref 3.5–5.3)
Total Bilirubin: 0.5 mg/dL (ref 0.3–1.2)

## 2013-05-18 LAB — COMPREHENSIVE METABOLIC PANEL
ALT: 13 U/L (ref 0–35)
AST: 18 U/L (ref 0–37)
Albumin: 4.3 g/dL (ref 3.5–5.2)
Alkaline Phosphatase: 75 U/L (ref 39–117)
BUN: 8 mg/dL (ref 6–23)
CO2: 27 mEq/L (ref 19–32)
Calcium: 9.5 mg/dL (ref 8.4–10.5)
Chloride: 103 mEq/L (ref 96–112)
Creat: 0.84 mg/dL (ref 0.50–1.10)
Glucose, Bld: 86 mg/dL (ref 70–99)
Potassium: 3.9 mEq/L (ref 3.5–5.3)
Sodium: 137 mEq/L (ref 135–145)
Total Bilirubin: 0.5 mg/dL (ref 0.3–1.2)
Total Protein: 6.9 g/dL (ref 6.0–8.3)

## 2013-05-18 LAB — CBC WITH DIFFERENTIAL/PLATELET
Basophils Absolute: 0 10*3/uL (ref 0.0–0.1)
Basophils Relative: 0 % (ref 0–1)
Eosinophils Relative: 1 % (ref 0–5)
HCT: 37.4 % (ref 36.0–46.0)
Hemoglobin: 12.3 g/dL (ref 12.0–15.0)
MCHC: 32.9 g/dL (ref 30.0–36.0)
MCV: 94.7 fL (ref 78.0–100.0)
Monocytes Absolute: 0.6 10*3/uL (ref 0.1–1.0)
Monocytes Relative: 6 % (ref 3–12)
Neutro Abs: 5.5 10*3/uL (ref 1.7–7.7)
RDW: 14 % (ref 11.5–15.5)

## 2013-05-18 LAB — LIPID PANEL
Cholesterol: 155 mg/dL (ref 0–200)
HDL: 75 mg/dL (ref >39)
Triglycerides: 51 mg/dL (ref <150)

## 2013-05-18 LAB — TSH: TSH: 1.331 u[IU]/mL (ref 0.350–4.500)

## 2013-05-18 LAB — T4: T4, Total: 8.1 ug/dL (ref 5.0–12.5)

## 2013-05-21 ENCOUNTER — Encounter: Payer: Self-pay | Admitting: *Deleted

## 2013-05-21 ENCOUNTER — Encounter: Payer: 59 | Attending: General Surgery | Admitting: *Deleted

## 2013-05-21 DIAGNOSIS — Z713 Dietary counseling and surveillance: Secondary | ICD-10-CM | POA: Insufficient documentation

## 2013-05-21 NOTE — Progress Notes (Signed)
  Pre-Op Assessment Visit:  Pre-Operative Sleeve Gastrectomy Surgery  Medical Nutrition Therapy:  Appt start time: 1000   End time:  1030 and 1100.  Patient was seen on 05/21/2013 for Pre-Operative Sleeve Gastrectomy Nutrition Assessment. Assessment and letter of approval faxed to [facility] Bariatric Surgery Program coordinator on 05/21/2013.   Handouts given during visit include:  Pre-Op Goals Bariatric Surgery Protein Shakes  Patient to call the [facility] to enroll in Pre-Op and Post-Op Nutrition Education when surgery date is scheduled.

## 2013-05-21 NOTE — Patient Instructions (Addendum)
   Follow Pre-Op Nutrition Goals to prepare for Sleeve Gastrectomy Surgery.   Call the [facility] at [phone] once you have been given your surgery date to enrolled in the Pre-Op Nutrition Class. You will need to attend this nutrition class 3-4 weeks prior to your surgery.  

## 2013-05-24 ENCOUNTER — Ambulatory Visit (HOSPITAL_COMMUNITY)
Admission: RE | Admit: 2013-05-24 | Discharge: 2013-05-24 | Disposition: A | Discharge status: Home / Self Care | Payer: 59 | Source: Ambulatory Visit | Attending: General Surgery | Admitting: General Surgery

## 2013-05-24 ENCOUNTER — Other Ambulatory Visit: Payer: Self-pay

## 2013-05-24 DIAGNOSIS — K824 Cholesterolosis of gallbladder: Secondary | ICD-10-CM | POA: Insufficient documentation

## 2013-05-24 DIAGNOSIS — F329 Major depressive disorder, single episode, unspecified: Secondary | ICD-10-CM | POA: Insufficient documentation

## 2013-05-24 DIAGNOSIS — F909 Attention-deficit hyperactivity disorder, unspecified type: Secondary | ICD-10-CM | POA: Insufficient documentation

## 2013-05-24 DIAGNOSIS — F3289 Other specified depressive episodes: Secondary | ICD-10-CM | POA: Insufficient documentation

## 2013-05-24 DIAGNOSIS — K219 Gastro-esophageal reflux disease without esophagitis: Secondary | ICD-10-CM | POA: Insufficient documentation

## 2013-05-24 DIAGNOSIS — E039 Hypothyroidism, unspecified: Secondary | ICD-10-CM | POA: Insufficient documentation

## 2013-05-24 DIAGNOSIS — M519 Unspecified thoracic, thoracolumbar and lumbosacral intervertebral disc disorder: Secondary | ICD-10-CM | POA: Insufficient documentation

## 2013-05-24 DIAGNOSIS — Z6834 Body mass index (BMI) 34.0-34.9, adult: Secondary | ICD-10-CM | POA: Insufficient documentation

## 2013-05-24 DIAGNOSIS — I059 Rheumatic mitral valve disease, unspecified: Secondary | ICD-10-CM | POA: Insufficient documentation

## 2013-06-05 ENCOUNTER — Telehealth: Payer: Self-pay | Admitting: Family Medicine

## 2013-06-05 NOTE — Telephone Encounter (Signed)
Wants to talk with someone regarding her ADHD medication.  States it does not seem to be working for her.  Please call Patient. Thanks

## 2013-06-05 NOTE — Telephone Encounter (Signed)
Patient states that she takes Vyvanse 70 mg daily and she is still having problems with inability to focus. She wants to know if this med needs to be changed?

## 2013-06-06 NOTE — Telephone Encounter (Signed)
She is on the maximum dose of Vyvanse.  Recommend office visit to discuss options.

## 2013-06-06 NOTE — Telephone Encounter (Signed)
Notified patient she is on the maximum dose of Vyvanse. Recommend office visit to discuss options. Patient stated she will call back tomorrow to schedule ov.

## 2013-06-12 ENCOUNTER — Other Ambulatory Visit: Payer: Self-pay | Admitting: Nurse Practitioner

## 2013-06-13 ENCOUNTER — Encounter: Payer: Self-pay | Admitting: Nurse Practitioner

## 2013-06-13 ENCOUNTER — Ambulatory Visit (INDEPENDENT_AMBULATORY_CARE_PROVIDER_SITE_OTHER): Payer: 59 | Admitting: Nurse Practitioner

## 2013-06-13 VITALS — BP 132/88 | Ht 69.0 in | Wt 226.0 lb

## 2013-06-13 DIAGNOSIS — Z0000 Encounter for general adult medical examination without abnormal findings: Secondary | ICD-10-CM

## 2013-06-13 DIAGNOSIS — Z01419 Encounter for gynecological examination (general) (routine) without abnormal findings: Secondary | ICD-10-CM

## 2013-06-13 DIAGNOSIS — Z124 Encounter for screening for malignant neoplasm of cervix: Secondary | ICD-10-CM

## 2013-06-13 DIAGNOSIS — F988 Other specified behavioral and emotional disorders with onset usually occurring in childhood and adolescence: Secondary | ICD-10-CM

## 2013-06-13 MED ORDER — AMPHETAMINE-DEXTROAMPHETAMINE 10 MG PO TABS: 10.0000 mg | ORAL_TABLET | Freq: Every day | ORAL | Status: DC

## 2013-06-13 MED ORDER — AMPHETAMINE-DEXTROAMPHET ER 30 MG PO CP24: 30.0000 mg | ORAL_CAPSULE | ORAL | Status: DC

## 2013-06-13 NOTE — Assessment & Plan Note (Signed)
Has started workup for bariatric surgery.

## 2013-06-13 NOTE — Assessment & Plan Note (Signed)
Meds ordered this encounter  Medications  . amphetamine-dextroamphetamine (ADDERALL XR) 30 MG 24 hr capsule    Sig: Take 1 capsule (30 mg total) by mouth every morning.    Dispense:  30 capsule    Refill:  0    Order Specific Question:  Supervising Provider    Answer:  Merlyn Albert [[id number]]  . amphetamine-dextroamphetamine (ADDERALL) 10 MG tablet    Sig: Take 1 tablet (10 mg total) by mouth daily. About 7-8 hours after morning dose    Dispense:  30 tablet    Refill:  0    Order Specific Question:  Supervising Provider    Answer:  Merlyn Albert [[id number]]   Hold on Vyvanse. Patient to call back within one month and let us know if med and dose are working. Sooner if any problems.  Next physical in one year.

## 2013-06-13 NOTE — Progress Notes (Signed)
Subjective:    Patient ID: Tammie Rodriguez, female    DOB: 1976-04-14, 37 y.o.   MRN: [id number]  HPI  Has begun workup for bariatric surgery.  Vyvanse does not seem to working as well.  Wants to try different medication. Does not want Intuniv at this point.  Same sexual partner for 1 1/2 years. He has had vasectomy.  Menses regular, heavy 3-4 days, total 7 days.  Has not had physical in several years.  No history of cervical cancer.  Overdue for dental and vision care.  Wears glasses.    Review of Systems  Constitutional: Positive for appetite change. Negative for fever, activity change and fatigue.  HENT: Negative for hearing loss, ear pain, congestion, sore throat, rhinorrhea and dental problem.   Eyes: Negative for visual disturbance.  Respiratory: Negative for cough, chest tightness, shortness of breath and wheezing.   Cardiovascular: Negative for chest pain, palpitations and leg swelling.  Gastrointestinal: Positive for diarrhea. Negative for nausea, vomiting, abdominal pain, constipation, blood in stool and abdominal distention.  Genitourinary: Negative for dysuria, urgency, frequency, vaginal discharge, enuresis, difficulty urinating, menstrual problem, pelvic pain and dyspareunia.  Neurological: Positive for headaches.  Psychiatric/Behavioral: Positive for sleep disturbance. Negative for dysphoric mood. The patient is nervous/anxious.        Objective:   Physical Exam  Vitals reviewed. Constitutional: She is oriented to person, place, and time. She appears well-developed. No distress.  HENT:  Right Ear: External ear normal.  Left Ear: External ear normal.  Mouth/Throat: Oropharynx is clear and moist.  Neck: Normal range of motion. Neck supple. No tracheal deviation present. No thyromegaly present.  Cardiovascular: Normal rate, regular rhythm and normal heart sounds.  Exam reveals no gallop.   No murmur heard. Pulmonary/Chest: Effort normal and breath sounds normal.  Abdominal:  Soft. She exhibits no distension. There is no tenderness.  Genitourinary: Vagina normal and uterus normal. No vaginal discharge found.  Musculoskeletal: She exhibits no edema.  Lymphadenopathy:    She has no cervical adenopathy.  Neurological: She is alert and oriented to person, place, and time.  Skin: Skin is warm and dry. No rash noted.  Psychiatric: She has a normal mood and affect. Her behavior is normal.  Breast exam: mild fibrocystic area; no dominant masses; axillae no adenopathy EGBUS: norma. Vagina minimal white mucoid discharge. No CMT Bimanual exam: no masses, mild left adnexal area tenderness        Assessment & Plan:  Well woman exam  Screening for cervical cancer - Plan: Pap IG, CT/NG w/ reflex HPV when ASC-U  ADD (attention deficit disorder)  Recommend dental and vision exams.  Discussed stress reduction.  Continue Ambien as directed. Rx sent in earlier today. Meds ordered this encounter  Medications  . amphetamine-dextroamphetamine (ADDERALL XR) 30 MG 24 hr capsule    Sig: Take 1 capsule (30 mg total) by mouth every morning.    Dispense:  30 capsule    Refill:  0    Order Specific Question:  Supervising Provider    Answer:  Merlyn Albert [[id number]]  . amphetamine-dextroamphetamine (ADDERALL) 10 MG tablet    Sig: Take 1 tablet (10 mg total) by mouth daily. About 7-8 hours after morning dose    Dispense:  30 tablet    Refill:  0    Order Specific Question:  Supervising Provider    Answer:  Merlyn Albert [[id number]]   Hold on Vyvanse. Patient to call back within one month and let us know if  med and dose are working. Sooner if any problems.  Next physical in one year.

## 2013-06-14 LAB — PAP IG, CT-NG, RFX HPV ASCU: Chlamydia Probe Amp: NEGATIVE

## 2013-06-18 ENCOUNTER — Telehealth: Payer: Self-pay | Admitting: Family Medicine

## 2013-06-18 MED ORDER — CLONAZEPAM 1 MG PO TABS: 1.0000 mg | ORAL_TABLET | Freq: Four times a day (QID) | ORAL | Status: DC | PRN

## 2013-06-18 NOTE — Telephone Encounter (Signed)
Faxed RX for Klonopin 1 mg numb thirty one q 6hr prn anxit drowsy prec. No ref to pharmacy. Patient was notified.

## 2013-06-18 NOTE — Telephone Encounter (Signed)
[name] or a nurse to call her about [place].  One example is when she is trying to eat she feels like she is choking, this occurs even when she is trying to swallow liquids.  States it is at the point where she is only taking in liquid food.  Her throat is not sore at all.  This has been going on for a little over a week, seems to be getting worse.  Please call Patient. Thanks

## 2013-06-18 NOTE — Telephone Encounter (Signed)
Klonopin 1 mg numb thirty one q 6hr prn anxit drowsy prec. No ref

## 2013-06-18 NOTE — Telephone Encounter (Signed)
Patient states that she seen Eber Jones on 06/13/13 for physical and the anxiety was not an issue then. It has gotten worse in the past few days. Does patient need to come in for office visit to address her anxiety issues or something called in?

## 2013-07-03 ENCOUNTER — Telehealth: Payer: Self-pay | Admitting: Nurse Practitioner

## 2013-07-03 NOTE — Telephone Encounter (Signed)
Patient would like Tammie Rodriguez to call her back regarding questions with her medications.

## 2013-07-04 ENCOUNTER — Other Ambulatory Visit: Payer: Self-pay | Admitting: Nurse Practitioner

## 2013-07-04 MED ORDER — LISDEXAMFETAMINE DIMESYLATE 70 MG PO CAPS: 70.0000 mg | ORAL_CAPSULE | ORAL | Status: DC

## 2013-07-04 NOTE — Telephone Encounter (Signed)
Pt calling to find out why she didn't receive a return call yesterday.  Advised Eber Jones would get back to her as soon as she can

## 2013-07-04 NOTE — Telephone Encounter (Signed)
Discussed with patient

## 2013-07-04 NOTE — Telephone Encounter (Signed)
Left message on voicemail to return call.

## 2013-07-04 NOTE — Telephone Encounter (Signed)
Difficult to get both of [place] on the phone at the same time; I reviewed her chart; we just switched her from Vyvanse to Adderall. Is that when the symptoms started.  Also, this may be unrelated to her ADD.  I talked to her last visit about anxiety and stress.  If she would like to discuss treatment options, I recommend an office visit.

## 2013-07-04 NOTE — Telephone Encounter (Signed)
Pt states that since starting adderall she has been angry and short with people. She states is starts about two hours after taking the med and starts to wear off later in the day. She states nothing has changed with her stress and anxiety. She thinks it is the adderall and wants to know if you think this is from the med and if so can you switch her back to the vyvance along with the other med you told her she could add it to. She cannot remember the name of med you told her.

## 2013-07-04 NOTE — Telephone Encounter (Signed)
Patient states that she is always mad and experiencing a lot of anxiety all the time. She is wondering if this is a side effect of the Adderall. Please advise.

## 2013-07-04 NOTE — Telephone Encounter (Signed)
Yes. Will switch her back to Vyvanse at previous dose. Once she is back on this for 1-2 weeks, have her call back if she still has problems, will add Intuniv.  Want to give her time to adjusted back on Vyvanse.

## 2013-07-15 ENCOUNTER — Other Ambulatory Visit: Payer: Self-pay | Admitting: Nurse Practitioner

## 2013-07-22 ENCOUNTER — Other Ambulatory Visit: Payer: Self-pay | Admitting: Nurse Practitioner

## 2013-07-22 ENCOUNTER — Other Ambulatory Visit: Payer: Self-pay | Admitting: Family Medicine

## 2013-08-11 ENCOUNTER — Emergency Department (HOSPITAL_COMMUNITY)
Admission: EM | Admit: 2013-08-11 | Discharge: 2013-08-11 | Disposition: A | Discharge status: Home / Self Care | Payer: 59 | Attending: Emergency Medicine | Admitting: Emergency Medicine

## 2013-08-11 ENCOUNTER — Encounter (HOSPITAL_COMMUNITY): Payer: Self-pay | Admitting: Emergency Medicine

## 2013-08-11 DIAGNOSIS — Z79899 Other long term (current) drug therapy: Secondary | ICD-10-CM | POA: Insufficient documentation

## 2013-08-11 DIAGNOSIS — R109 Unspecified abdominal pain: Secondary | ICD-10-CM

## 2013-08-11 DIAGNOSIS — E669 Obesity, unspecified: Secondary | ICD-10-CM | POA: Insufficient documentation

## 2013-08-11 DIAGNOSIS — K219 Gastro-esophageal reflux disease without esophagitis: Secondary | ICD-10-CM | POA: Insufficient documentation

## 2013-08-11 DIAGNOSIS — E079 Disorder of thyroid, unspecified: Secondary | ICD-10-CM | POA: Insufficient documentation

## 2013-08-11 DIAGNOSIS — F319 Bipolar disorder, unspecified: Secondary | ICD-10-CM | POA: Insufficient documentation

## 2013-08-11 DIAGNOSIS — R51 Headache: Secondary | ICD-10-CM | POA: Insufficient documentation

## 2013-08-11 DIAGNOSIS — R42 Dizziness and giddiness: Secondary | ICD-10-CM | POA: Insufficient documentation

## 2013-08-11 DIAGNOSIS — Z3202 Encounter for pregnancy test, result negative: Secondary | ICD-10-CM | POA: Insufficient documentation

## 2013-08-11 DIAGNOSIS — R112 Nausea with vomiting, unspecified: Secondary | ICD-10-CM | POA: Insufficient documentation

## 2013-08-11 DIAGNOSIS — F909 Attention-deficit hyperactivity disorder, unspecified type: Secondary | ICD-10-CM | POA: Insufficient documentation

## 2013-08-11 DIAGNOSIS — Z880 Allergy status to penicillin: Secondary | ICD-10-CM | POA: Insufficient documentation

## 2013-08-11 DIAGNOSIS — R1032 Left lower quadrant pain: Secondary | ICD-10-CM | POA: Insufficient documentation

## 2013-08-11 DIAGNOSIS — F411 Generalized anxiety disorder: Secondary | ICD-10-CM | POA: Insufficient documentation

## 2013-08-11 DIAGNOSIS — R630 Anorexia: Secondary | ICD-10-CM | POA: Insufficient documentation

## 2013-08-11 LAB — CBC WITH DIFFERENTIAL/PLATELET
Basophils Absolute: 0 10*3/uL (ref 0.0–0.1)
Basophils Relative: 0 % (ref 0–1)
Eosinophils Absolute: 0.1 10*3/uL (ref 0.0–0.7)
Eosinophils Relative: 1 % (ref 0–5)
HCT: 39.3 % (ref 36.0–46.0)
MCH: 31.4 pg (ref 26.0–34.0)
MCHC: 34.1 g/dL (ref 30.0–36.0)
MCV: 92 fL (ref 78.0–100.0)
Monocytes Absolute: 0.6 10*3/uL (ref 0.1–1.0)
RDW: 13.7 % (ref 11.5–15.5)

## 2013-08-11 LAB — WET PREP, GENITAL: Yeast Wet Prep HPF POC: NONE SEEN

## 2013-08-11 LAB — URINALYSIS, ROUTINE W REFLEX MICROSCOPIC
Glucose, UA: NEGATIVE mg/dL
Hgb urine dipstick: NEGATIVE
Ketones, ur: 15 mg/dL — AB
Protein, ur: NEGATIVE mg/dL

## 2013-08-11 LAB — COMPREHENSIVE METABOLIC PANEL
AST: 23 U/L (ref 0–37)
Albumin: 4.3 g/dL (ref 3.5–5.2)
CO2: 24 mEq/L (ref 19–32)
Calcium: 9.8 mg/dL (ref 8.4–10.5)
Creatinine, Ser: 0.78 mg/dL (ref 0.50–1.10)
GFR calc non Af Amer: >90 mL/min (ref >90)

## 2013-08-11 LAB — POCT PREGNANCY, URINE: Preg Test, Ur: NEGATIVE

## 2013-08-11 MED ORDER — PROMETHAZINE HCL 25 MG/ML IJ SOLN
25.0000 mg | Freq: Once | INTRAMUSCULAR | Status: AC
  Administered 2013-08-11: 25 mg via INTRAVENOUS
  Filled 2013-08-11: qty 1

## 2013-08-11 MED ORDER — KETOROLAC TROMETHAMINE 30 MG/ML IJ SOLN
30.0000 mg | Freq: Once | INTRAMUSCULAR | Status: AC
  Administered 2013-08-11: 30 mg via INTRAVENOUS
  Filled 2013-08-11: qty 1

## 2013-08-11 MED ORDER — ONDANSETRON HCL 4 MG/2ML IJ SOLN
4.0000 mg | Freq: Once | INTRAMUSCULAR | Status: AC
  Administered 2013-08-11: 4 mg via INTRAVENOUS
  Filled 2013-08-11: qty 2

## 2013-08-11 MED ORDER — SODIUM CHLORIDE 0.9 % IV BOLUS (SEPSIS)
1000.0000 mL | Freq: Once | INTRAVENOUS | Status: AC
  Administered 2013-08-11: 1000 mL via INTRAVENOUS

## 2013-08-11 NOTE — ED Notes (Signed)
Nausea on Friday, vomited once after left work, yesterday still nauseated, dizzy, felt like she was going to pass out, vomiting all night, c/o abdominal pain, went to urgent care, given phenergen IM, attempted IV without success, given zofran, have not taken, given prescriptions, have not had filled

## 2013-08-11 NOTE — ED Provider Notes (Signed)
CSN: [id number]     Arrival date & time 08/11/13  1139 History   First MD Initiated Contact with Patient 08/11/13 1223     Chief Complaint  Patient presents with  . Abdominal Pain  . Nausea  . Emesis    HPI  Tammie Rodriguez is a 37 y.o. female with a PMH of thyroid disease, ADHD, acid reflux, depression, anxiety, bipolar disorder, obesity, and reflus who presents to the ED for evaluation of abdominal pain, nausea, and emesis.  History was provided by the patient.  Patient states she has had nausea and vomiting since Friday (3 days ago).  She states that she has had about two episodes of vomiting daily.  No hematemesis.  She also complains of a cramping intermittent epigastric pain which is worse with vomiting.  She has had decreased intake and appetite due to nausea.  She has been taking Ibuprofen for pain.  She feels lightheaded with no syncope or dizziness.  She went to [facility] for a similar complaint today and was given a prescription for Phenergan, however did not fill it.  She states she came to the ED because she felt "worse."   She also has a generalized mild headache.  She denies any diarrhea, constipation, dysuria, vaginal discharge, vaginal bleeding, or genital sores.  She is currently sexually active.  She had a similar episodes several years ago with no known cause.  She denies any sick contacts or recent travel.  She did eat at a [place] Friday and then her symptoms began shortly after that.  No previous abdominal surgeries.  No fevers, chills, rhinorrhea, cough, chest pain, SOB, back pain, or leg edema.   Past Medical History  Diagnosis Date  . Thyroid disease   . ADHD (attention deficit hyperactivity disorder)   . Acid reflux   . Depression   . Anxiety   . Bipolar disorder   . Obesity   . Reflux    Past Surgical History  Procedure Laterality Date  . Thumb arthroscopy    . Ligament repair  2007   Family History  Problem Relation Age of Onset  . Heart failure  Maternal Grandmother   . Diabetes Maternal Grandmother   . Hypertension Mother    History  Substance Use Topics  . Smoking status: Never Smoker   . Smokeless tobacco: Not on file  . Alcohol Use: Yes   OB History   Grav Para Term Preterm Abortions TAB SAB Ect Mult Living                 Review of Systems  Constitutional: Positive for appetite change. Negative for fever, diaphoresis, activity change and fatigue.  HENT: Negative for congestion, rhinorrhea and sore throat.   Respiratory: Negative for cough and shortness of breath.   Cardiovascular: Negative for chest pain and leg swelling.  Gastrointestinal: Positive for nausea, vomiting and abdominal pain. Negative for diarrhea, constipation, blood in stool and anal bleeding.  Genitourinary: Negative for dysuria, frequency, hematuria, flank pain, decreased urine volume, vaginal bleeding, vaginal discharge, genital sores, vaginal pain and pelvic pain.  Musculoskeletal: Negative for back pain.  Skin: Negative for color change, rash and wound.  Neurological: Positive for light-headedness and headaches. Negative for dizziness, syncope, weakness and numbness.  Psychiatric/Behavioral: Negative for confusion.    Allergies  Adderall xr; Amoxicillin; and Penicillins  Home Medications   Current Outpatient Rx  Name  Route  Sig  Dispense  Refill  . buPROPion (WELLBUTRIN XL) 300 MG 24 hr  tablet      TAKE 1 TABLET BY MOUTH DAILY   30 tablet   2   . clonazePAM (KLONOPIN) 1 MG tablet   Oral   Take 1 tablet (1 mg total) by mouth every 6 (six) hours as needed for anxiety.   30 tablet   0   . lamoTRIgine (LAMICTAL) 100 MG tablet      TAKE 1 TABLET BY MOUTH EVERY NIGHT AT BEDTIME   30 tablet   5   . levothyroxine (SYNTHROID, LEVOTHROID) 137 MCG tablet      TAKE ONE TABLET BY MOUTH DAILY FOR THYROID   30 tablet   5   . lisdexamfetamine (VYVANSE) 70 MG capsule   Oral   Take 1 capsule (70 mg total) by mouth every morning.   30  capsule   0     May fill after 09/02/13   . omeprazole (PRILOSEC) 20 MG capsule   Oral   Take 20 mg by mouth daily as needed.          . zolpidem (AMBIEN) 10 MG tablet      TAKE 1 TABLET BY MOUTH ONCE DAILY AT BEDTIME   30 tablet   2     [facility] [place]. [phone]    BP 128/84  Pulse 71  Temp(Src) 98.1 F (36.7 C) (Oral)  SpO2 100%  Filed Vitals:   08/11/13 1153 08/11/13 1525 08/11/13 1526 08/11/13 1527  BP: 124/84 116/60 137/76 128/84  Pulse: 80 68 88 71  Temp: 98.1 F (36.7 C)     TempSrc: Oral     SpO2: 100%       Physical Exam  Nursing note and vitals reviewed. Constitutional: She is oriented to person, place, and time. She appears well-developed and well-nourished. No distress.  HENT:  Head: Normocephalic and atraumatic.  Right Ear: External ear normal.  Left Ear: External ear normal.  Nose: Nose normal.  Mouth/Throat: Oropharynx is clear and moist.  Eyes: Conjunctivae are normal. Pupils are equal, round, and reactive to light. Right eye exhibits no discharge. Left eye exhibits no discharge.  Neck: Normal range of motion. Neck supple.  Cardiovascular: Normal rate, regular rhythm, normal heart sounds and intact distal pulses.  Exam reveals no gallop and no friction rub.   No murmur heard. Pulmonary/Chest: Effort normal and breath sounds normal. No respiratory distress. She has no wheezes. She has no rales. She exhibits no tenderness.  Abdominal: Soft. Bowel sounds are normal. She exhibits no distension and no mass. There is tenderness. There is no rebound and no guarding.  LLQ tenderness to palpation   Musculoskeletal: Normal range of motion. She exhibits no edema and no tenderness.  Neurological: She is alert and oriented to person, place, and time.  Skin: Skin is warm and dry. She is not diaphoretic.    ED Course  Procedures (including critical care time) Labs Review Labs Reviewed  URINALYSIS, ROUTINE W REFLEX MICROSCOPIC -  Abnormal; Notable for the following:    Ketones, ur 15 (*)    All other components within normal limits  CBC WITH DIFFERENTIAL  COMPREHENSIVE METABOLIC PANEL  LIPASE, BLOOD  POCT PREGNANCY, URINE   Imaging Review No results found.  EKG Interpretation   None      Results for orders placed during the hospital encounter of 08/11/13  WET PREP, GENITAL      Result Value Range   Yeast Wet Prep HPF POC NONE SEEN  NONE SEEN   Trich,  Wet Prep NONE SEEN  NONE SEEN   Clue Cells Wet Prep HPF POC NONE SEEN  NONE SEEN   WBC, Wet Prep HPF POC FEW (*) NONE SEEN  GC/CHLAMYDIA PROBE AMP      Result Value Range   CT Probe RNA NEGATIVE  NEGATIVE   GC Probe RNA NEGATIVE  NEGATIVE  URINALYSIS, ROUTINE W REFLEX MICROSCOPIC      Result Value Range   Color, Urine YELLOW  YELLOW   APPearance CLEAR  CLEAR   Specific Gravity, Urine 1.008  1.005 - 1.030   pH 6.5  5.0 - 8.0   Glucose, UA NEGATIVE  NEGATIVE mg/dL   Hgb urine dipstick NEGATIVE  NEGATIVE   Bilirubin Urine NEGATIVE  NEGATIVE   Ketones, ur 15 (*) NEGATIVE mg/dL   Protein, ur NEGATIVE  NEGATIVE mg/dL   Urobilinogen, UA 0.2  0.0 - 1.0 mg/dL   Nitrite NEGATIVE  NEGATIVE   Leukocytes, UA NEGATIVE  NEGATIVE  CBC WITH DIFFERENTIAL      Result Value Range   WBC 8.1  4.0 - 10.5 K/uL   RBC 4.27  3.87 - 5.11 MIL/uL   Hemoglobin 13.4  12.0 - 15.0 g/dL   HCT 40.9  81.1 - 91.4 %   MCV 92.0  78.0 - 100.0 fL   MCH 31.4  26.0 - 34.0 pg   MCHC 34.1  30.0 - 36.0 g/dL   RDW 78.2  95.6 - 21.3 %   Platelets 347  150 - 400 K/uL   Neutrophils Relative % 71  43 - 77 %   Neutro Abs 5.7  1.7 - 7.7 K/uL   Lymphocytes Relative 21  12 - 46 %   Lymphs Abs 1.7  0.7 - 4.0 K/uL   Monocytes Relative 7  3 - 12 %   Monocytes Absolute 0.6  0.1 - 1.0 K/uL   Eosinophils Relative 1  0 - 5 %   Eosinophils Absolute 0.1  0.0 - 0.7 K/uL   Basophils Relative 0  0 - 1 %   Basophils Absolute 0.0  0.0 - 0.1 K/uL  COMPREHENSIVE METABOLIC PANEL      Result Value Range    Sodium 138  135 - 145 mEq/L   Potassium 3.4 (*) 3.5 - 5.1 mEq/L   Chloride 103  96 - 112 mEq/L   CO2 24  19 - 32 mEq/L   Glucose, Bld 97  70 - 99 mg/dL   BUN 7  6 - 23 mg/dL   Creatinine, Ser 0.86  0.50 - 1.10 mg/dL   Calcium 9.8  8.4 - 57.8 mg/dL   Total Protein 7.5  6.0 - 8.3 g/dL   Albumin 4.3  3.5 - 5.2 g/dL   AST 23  0 - 37 U/L   ALT 24  0 - 35 U/L   Alkaline Phosphatase 97  39 - 117 U/L   Total Bilirubin 0.6  0.3 - 1.2 mg/dL   GFR calc non Af Amer >90  >90 mL/min   GFR calc Af Amer >90  >90 mL/min  LIPASE, BLOOD      Result Value Range   Lipase 19  11 - 59 U/L  POCT PREGNANCY, URINE      Result Value Range   Preg Test, Ur NEGATIVE  NEGATIVE    MDM   1. Abdominal pain   2. Nausea and vomiting     Tammie Rodriguez is a 37 y.o. female with a PMH of thyroid disease, ADHD, acid reflux,  depression, anxiety, bipolar disorder, obesity, and reflux who presents to the ED for evaluation of abdominal pain, nausea, and emesis.  CBC, CMP, lipase, UA, and urine pregnancy ordered to further evaluate.  1L fluids ordered.  Zofran ordered for nausea.    Rechecks  3:00 PM = Pelvic exam performed at bedside with RN present.  Wet mount and GC cultures sent.  Minimal amount of thin white discharge present in the vaginal vault.  No CMT of adnexal tenderness.  No vaginal bleeding.   3:43 PM = Repeat abdominal exam benign.  Patient in no acute distress.  Patient states she feels nauseated again and has a headache.  Phenergan and Toradol ordered.   4:45 PM = Patient asking for something to eat or drink.   5:45 PM = Patient able to keep ginger ale and crackers down without any difficulty.  States she feels better.  Still has mild headache and lightheadedness but abdominal pain and nausea significantly improved.  Ready for discharge.  Walking around the room.     Etiology of abdominal pain, nausea, and emesis is possibly due to gastritis.  Patient's symptoms began after eating out at a restaurant.   Patient's initial complaint was epigastric pain worse with vomiting.  Patient initially had LLQ tenderness to palpation on exam, however, did not complain of LLQ pain.  A pelvic exam was performed which was benign.  Her repeat abdominal exams were benign as well. Her labs were within normal limits.  She had no episodes of emesis throughout her ED visit and was able to tolerate PO intake.  Patient afebrile, non-toxic in appearance, and remained in no acute distress throughout her ED visit.  She was prescribed Phenergan from the [facility] clinic prior to arrival.  She was instructed to use this prescription as needed for nausea.  She was instructed to follow-up with her PCP in the next 1-2 days for further evaluation and management.  She was instructed to return to the ED if she has any fever, change/worsening abdominal pain, repeated emesis, or other concerns.  Patient was in agreement with discharge and plan.  Obtaining ride home.    Final impressions: 1. Abdominal pain  2. Nausea and vomiting     Luiz Iron PA-C   This patient was discussed with Dr. Nigel Mormon, PA-C 08/13/13 1225

## 2013-08-20 NOTE — ED Provider Notes (Signed)
Medical screening examination/treatment/procedure(s) were conducted as a shared visit with non-physician practitioner(s) and myself.  I personally evaluated the patient during the encounter.  EKG Interpretation   None       Pt c/o nv, abd pain. Ivf, zofran. Recheck pt comfortable, nad. Po challenge.   Suzi Roots, MD 08/20/13 1239

## 2013-08-23 ENCOUNTER — Ambulatory Visit (INDEPENDENT_AMBULATORY_CARE_PROVIDER_SITE_OTHER): Payer: 59 | Admitting: Nurse Practitioner

## 2013-08-23 ENCOUNTER — Encounter: Payer: Self-pay | Admitting: Nurse Practitioner

## 2013-08-23 VITALS — BP 128/88 | Temp 98.5°F | Ht 69.0 in | Wt 221.2 lb

## 2013-08-23 DIAGNOSIS — R1011 Right upper quadrant pain: Secondary | ICD-10-CM

## 2013-08-23 DIAGNOSIS — R112 Nausea with vomiting, unspecified: Secondary | ICD-10-CM

## 2013-08-23 DIAGNOSIS — G8929 Other chronic pain: Secondary | ICD-10-CM

## 2013-08-23 MED ORDER — CLONAZEPAM 1 MG PO TABS: 1.0000 mg | ORAL_TABLET | Freq: Two times a day (BID) | ORAL | Status: DC | PRN

## 2013-08-28 ENCOUNTER — Encounter: Payer: Self-pay | Admitting: Nurse Practitioner

## 2013-08-28 NOTE — Progress Notes (Signed)
Subjective:  Presents for c/o mid upper abd pain. Occurs about twice per week, lasting up to several hours then has nausea/diarrhea. On Omeprazole 20 mg qd. No reflux symptoms. Rare vomiting. No fever. Unassociated with any particular foods. Diarrhea 2-3 times per week, no constipation. No blood in stool. Had abd [place] 04/23/2013. Nonsmoker. Light alcohol intake. Under increased stress with PTSD related to her job. Has access to a counselor.  Objective:   BP 128/88  Temp(Src) 98.5 F (36.9 C)  Ht 5\' 9"  (1.753 m)  Wt 221 lb 3.2 oz (100.336 kg)  BMI 32.65 kg/m2 NAD. Alert, oriented. Lungs clear. Heart RRR. Abdomen soft, nondistended with RUQ tenderness on exam. No rebound or guarding. No obvious organomegaly. See labs from ED 08/11/2013.  Assessment: Abdominal pain, chronic, right upper quadrant - Plan: NM Hepato W/EjeCT Fract  Nausea with vomiting - Plan: NM Hepato W/EjeCT Fract Anxiety exacerbation related to situational stress  Plan:  Meds ordered this encounter  Medications  . clonazePAM (KLONOPIN) 1 MG tablet    Sig: Take 1 tablet (1 mg total) by mouth 2 (two) times daily as needed for anxiety.    Dispense:  30 tablet    Refill:  0    Order Specific Question:  Supervising Provider    Answer:  Merlyn Albert [[id number]]  . promethazine (PHENERGAN) 25 MG tablet    Sig:    Warning signs reviewed. Refer to GI specialist. Call sooner or go to ED if worse.

## 2013-08-29 ENCOUNTER — Telehealth: Payer: Self-pay | Admitting: *Deleted

## 2013-08-29 NOTE — Telephone Encounter (Signed)
Pt needs HIDA scan scheduled at [place]. Pt wants to go the week of NOV 17-21. Please call patient after scheduling test.

## 2013-08-30 ENCOUNTER — Other Ambulatory Visit: Payer: Self-pay | Admitting: Nurse Practitioner

## 2013-08-30 ENCOUNTER — Other Ambulatory Visit: Payer: Self-pay | Admitting: *Deleted

## 2013-08-30 ENCOUNTER — Telehealth: Payer: Self-pay | Admitting: Family Medicine

## 2013-08-30 DIAGNOSIS — R109 Unspecified abdominal pain: Secondary | ICD-10-CM

## 2013-08-30 MED ORDER — PANTOPRAZOLE SODIUM 40 MG PO TBEC: 40.0000 mg | DELAYED_RELEASE_TABLET | Freq: Every day | ORAL | Status: DC

## 2013-08-30 MED ORDER — PROMETHAZINE HCL 25 MG PO TABS: 25.0000 mg | ORAL_TABLET | ORAL | Status: DC | PRN

## 2013-08-30 MED ORDER — DICYCLOMINE HCL 10 MG PO CAPS: 10.0000 mg | ORAL_CAPSULE | Freq: Three times a day (TID) | ORAL | Status: AC

## 2013-08-30 NOTE — Telephone Encounter (Signed)
Was seen on 08/23/2013 by Eber Jones.  Patient states she is having severe Stomach pain.  States Eber Jones was going to call in medication for stomach spasms and nausea.  However the pharmacy does not have a record of any  Medications being phoned in.  Also, wants to know when the referral for the scan will be completed, best days for patient is November 17 through November 20 or anytime during the following week. Also wants to know if she can have this completed at [facility] due to her working in [place].  Wants to know in the meantime is there anything that can be taken for the discomfort.  Would like someone to call as soon as possible.  States if you get a voicemail please leave a message and she will return the call as soon as possible.  She is an Technical sales engineer and [place]

## 2013-08-30 NOTE — Telephone Encounter (Signed)
HIDA Scan scheduled 09/10/13 at 7 am at [facility]. Patient notified.

## 2013-08-30 NOTE — Telephone Encounter (Signed)
Sent in 3 Rx: Protonix (hold on Omeprazole/Prilosec); Bentyl for abd spasms; refilled Phenergan for nausea.

## 2013-08-30 NOTE — Telephone Encounter (Signed)
FYI-referral to GI is currently on hold, will process once results of HIDA scan are received, may need GI or may need general surgery depending on results

## 2013-08-30 NOTE — Telephone Encounter (Addendum)
Patient notified and aware of HIDA Scan Appt.

## 2013-08-30 NOTE — Telephone Encounter (Signed)
Noted. Thanks.

## 2013-09-10 ENCOUNTER — Encounter (HOSPITAL_COMMUNITY): Admission: RE | Admit: 2013-09-10 | Payer: 59 | Source: Ambulatory Visit

## 2013-09-11 ENCOUNTER — Telehealth: Payer: Self-pay

## 2013-09-11 NOTE — Telephone Encounter (Signed)
Patient states that she set her alarm for the worng time and missed the appointment for the HIDA scan. She is going to call [facility] and see if she can reschedule because she wants to have the HIDA scan done first before the referral. She said she will call [facility] in the morning to reschedule.

## 2013-09-11 NOTE — Telephone Encounter (Signed)
Message copied by Dereck Ligas on Wed Sep 11, 2013  4:17 PM ------      Message from: Campbell Riches      Created: Wed Sep 11, 2013  3:12 PM      Regarding: FW: help       Please contact patient. Since she did not get HIDA scan, does she still want GI consult? Eber Jones      ----- Message -----         From: Alphonzo Lemmings         Sent: 09/11/2013   2:17 PM           To: Campbell Riches, NP      Subject: help                                                     Patient cancelled/no showed her HIDA scan at [facility] 09/10/13, was waiting for those results to determine GI referral or general surgery referral, please advise            Varney Daily        ------

## 2013-09-13 ENCOUNTER — Other Ambulatory Visit: Payer: Self-pay | Admitting: Nurse Practitioner

## 2013-09-13 NOTE — Telephone Encounter (Signed)
Last office visit 08-23-13

## 2013-09-13 NOTE — Telephone Encounter (Signed)
Ok plus 5 ref 

## 2013-09-25 ENCOUNTER — Other Ambulatory Visit: Payer: Self-pay | Admitting: Nurse Practitioner

## 2013-09-26 ENCOUNTER — Other Ambulatory Visit: Payer: Self-pay | Admitting: *Deleted

## 2013-10-14 ENCOUNTER — Ambulatory Visit: Payer: 59 | Admitting: Nurse Practitioner

## 2013-10-27 ENCOUNTER — Other Ambulatory Visit: Payer: Self-pay | Admitting: Nurse Practitioner

## 2013-10-29 ENCOUNTER — Telehealth: Payer: Self-pay | Admitting: Family Medicine

## 2013-10-29 MED ORDER — LISDEXAMFETAMINE DIMESYLATE 70 MG PO CAPS: 70.0000 mg | ORAL_CAPSULE | ORAL | Status: DC

## 2013-10-29 NOTE — Telephone Encounter (Signed)
Rx up front for patient pick up. Patient notified. 

## 2013-10-29 NOTE — Telephone Encounter (Signed)
Patient needs refill on Vyvanse 70mg  she is completely out.

## 2013-10-29 NOTE — Addendum Note (Signed)
Addended by: Margaretha SheffieldBROWN, AUTUMN S on: 10/29/2013 11:22 AM   Modules accepted: Orders

## 2013-10-29 NOTE — Telephone Encounter (Signed)
Was switched to Vyvance via phone 07/04/13 without follow up office visit

## 2013-10-29 NOTE — Telephone Encounter (Signed)
30 d worth, no more until seen agin in off

## 2013-11-25 ENCOUNTER — Ambulatory Visit: Payer: 59 | Admitting: Nurse Practitioner

## 2013-12-11 ENCOUNTER — Ambulatory Visit: Payer: 59 | Admitting: Nurse Practitioner

## 2013-12-12 ENCOUNTER — Encounter: Payer: Self-pay | Admitting: Nurse Practitioner

## 2013-12-12 ENCOUNTER — Ambulatory Visit (INDEPENDENT_AMBULATORY_CARE_PROVIDER_SITE_OTHER): Payer: 59 | Admitting: Nurse Practitioner

## 2013-12-12 VITALS — BP 120/78 | Ht 69.0 in | Wt 228.0 lb

## 2013-12-12 DIAGNOSIS — G4700 Insomnia, unspecified: Secondary | ICD-10-CM

## 2013-12-12 DIAGNOSIS — E039 Hypothyroidism, unspecified: Secondary | ICD-10-CM

## 2013-12-12 DIAGNOSIS — F3289 Other specified depressive episodes: Secondary | ICD-10-CM

## 2013-12-12 DIAGNOSIS — F411 Generalized anxiety disorder: Secondary | ICD-10-CM

## 2013-12-12 DIAGNOSIS — F329 Major depressive disorder, single episode, unspecified: Secondary | ICD-10-CM

## 2013-12-12 DIAGNOSIS — K219 Gastro-esophageal reflux disease without esophagitis: Secondary | ICD-10-CM

## 2013-12-12 DIAGNOSIS — F32A Depression, unspecified: Secondary | ICD-10-CM

## 2013-12-12 DIAGNOSIS — F5104 Psychophysiologic insomnia: Secondary | ICD-10-CM

## 2013-12-12 DIAGNOSIS — F988 Other specified behavioral and emotional disorders with onset usually occurring in childhood and adolescence: Secondary | ICD-10-CM

## 2013-12-12 MED ORDER — LISDEXAMFETAMINE DIMESYLATE 70 MG PO CAPS: 70.0000 mg | ORAL_CAPSULE | ORAL | Status: DC

## 2013-12-12 MED ORDER — BUPROPION HCL ER (XL) 300 MG PO TB24: ORAL_TABLET | ORAL | Status: DC

## 2013-12-12 MED ORDER — PANTOPRAZOLE SODIUM 40 MG PO TBEC: 40.0000 mg | DELAYED_RELEASE_TABLET | Freq: Every day | ORAL | Status: DC

## 2013-12-12 MED ORDER — CLONAZEPAM 1 MG PO TABS: ORAL_TABLET | ORAL | Status: DC

## 2013-12-12 MED ORDER — LAMOTRIGINE 100 MG PO TABS: ORAL_TABLET | ORAL | Status: DC

## 2013-12-12 MED ORDER — LEVOTHYROXINE SODIUM 137 MCG PO TABS: ORAL_TABLET | ORAL | Status: DC

## 2013-12-16 ENCOUNTER — Encounter: Payer: Self-pay | Admitting: Nurse Practitioner

## 2013-12-16 DIAGNOSIS — F411 Generalized anxiety disorder: Secondary | ICD-10-CM | POA: Insufficient documentation

## 2013-12-16 NOTE — Progress Notes (Signed)
Subjective:  Presents for routine followup of her ADHD. Doing well on Vyvanse. Denies any adverse affects. 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 Does not want to change her medication regimen at this time. Denies any suicidal thoughts ideation. Sees a psychologist on a regular basis.  Objective:   BP 120/78  Ht 5\' 9"  (1.753 m)  Wt 228 lb (103.42 kg)  BMI 33.65 kg/m2 NAD. Alert, oriented. Lungs clear. Heart regular rate rhythm. Thyroid normal limit to palpation and nontender. Lower extremities no edema. Mildly anxious affect.   Assessment: Problem List Items Addressed This Visit     Digestive   GERD (gastroesophageal reflux disease)   Relevant Medications      pantoprazole (PROTONIX) EC tablet     Endocrine   Hypothyroid   Relevant Medications      levothyroxine (SYNTHROID, LEVOTHROID) tablet     Other   Chronic insomnia   Depression   Relevant Medications      buPROPion (WELLBUTRIN XL) 24 hr tablet   ADD (attention deficit disorder) - Primary    Other Visit Diagnoses   Anxiety state, unspecified          Plan: Meds ordered this encounter  Medications  . DISCONTD: lisdexamfetamine (VYVANSE) 70 MG capsule    Sig: Take 1 capsule (70 mg total) by mouth every morning.    Dispense:  30 capsule    Refill:  0    Order Specific Question:  Supervising Provider    Answer:  Merlyn AlbertLUKING, WILLIAM S [[id number]]  . DISCONTD: lisdexamfetamine (VYVANSE) 70 MG capsule    Sig: Take 1 capsule (70 mg total) by mouth every morning.    Dispense:  30 capsule    Refill:  0    May fill 30 days from 12/12/13    Order Specific Question:  Supervising Provider    Answer:  Merlyn AlbertLUKING, WILLIAM S [[id number]]  . lisdexamfetamine (VYVANSE) 70 MG capsule    Sig: Take 1 capsule (70 mg total) by mouth every morning.    Dispense:  30 capsule    Refill:  0    May fill 60 days from 12/12/13    Order Specific Question:  Supervising Provider    Answer:  Merlyn AlbertLUKING, WILLIAM S  [[id number]]  . pantoprazole (PROTONIX) 40 MG tablet    Sig: Take 1 tablet (40 mg total) by mouth daily. Prn abd pain or acid reflux    Dispense:  30 tablet    Refill:  5    Order Specific Question:  Supervising Provider    Answer:  Merlyn AlbertLUKING, WILLIAM S [[id number]]  . levothyroxine (SYNTHROID, LEVOTHROID) 137 MCG tablet    Sig: TAKE ONE TABLET BY MOUTH DAILY FOR THYROID    Dispense:  30 tablet    Refill:  5    Order Specific Question:  Supervising Provider    Answer:  Merlyn AlbertLUKING, WILLIAM S [[id number]]  . lamoTRIgine (LAMICTAL) 100 MG tablet    Sig: TAKE 1 TABLET BY MOUTH EVERY NIGHT AT BEDTIME    Dispense:  30 tablet    Refill:  5    Order Specific Question:  Supervising Provider    Answer:  Merlyn AlbertLUKING, WILLIAM S [[id number]]  . clonazePAM (KLONOPIN) 1 MG tablet    Sig: TAKE 1 TABLET BY MOUTH TWICE DAILY AS NEEDED FOR ANXIETY    Dispense:  30 tablet    Refill:  5    May refill monthly    Order Specific Question:  Supervising  Provider    Answer:  Merlyn Albert [[id number]]  . buPROPion (WELLBUTRIN XL) 300 MG 24 hr tablet    Sig: TAKE 1 TABLET BY MOUTH DAILY    Dispense:  30 tablet    Refill:  5    Order Specific Question:  Supervising Provider    Answer:  Merlyn Albert [[id number]]   Discussed the importance of stress reduction. According to patient she has an excellent support system. Continue current medications as directed. Return in about 3 months (around 03/11/2014).

## 2014-03-07 ENCOUNTER — Other Ambulatory Visit: Payer: Self-pay | Admitting: Family Medicine

## 2014-03-07 NOTE — Telephone Encounter (Signed)
Ok plus 3 ref 

## 2014-03-12 ENCOUNTER — Telehealth: Payer: Self-pay | Admitting: Family Medicine

## 2014-03-12 MED ORDER — LISDEXAMFETAMINE DIMESYLATE 70 MG PO CAPS: 70.0000 mg | ORAL_CAPSULE | ORAL | Status: DC

## 2014-03-12 MED ORDER — ZOLPIDEM TARTRATE 10 MG PO TABS: 10.0000 mg | ORAL_TABLET | Freq: Every evening | ORAL | Status: DC | PRN

## 2014-03-12 NOTE — Telephone Encounter (Signed)
Ok time one on vyav, times six on amb , adhd every four mos at this age so next appt next mo

## 2014-03-12 NOTE — Telephone Encounter (Signed)
Last seen 12/12/13 for check up.

## 2014-03-12 NOTE — Telephone Encounter (Signed)
Patient needs Rx for ambien to [facility] [address], Also, needs Rx for generic Vyvanse. She wants to know if we can mail this to her if possible?

## 2014-03-12 NOTE — Telephone Encounter (Signed)
ambien faxed to pharm. vyvance mailed to pt. Pt notifed that she needs office visit before further refills on the vyvance.

## 2014-03-26 ENCOUNTER — Encounter: Payer: Self-pay | Admitting: Nurse Practitioner

## 2014-03-26 ENCOUNTER — Ambulatory Visit (INDEPENDENT_AMBULATORY_CARE_PROVIDER_SITE_OTHER): Payer: 59 | Admitting: Nurse Practitioner

## 2014-03-26 VITALS — BP 132/88 | Ht 69.0 in | Wt 233.0 lb

## 2014-03-26 DIAGNOSIS — E039 Hypothyroidism, unspecified: Secondary | ICD-10-CM

## 2014-03-26 DIAGNOSIS — F5104 Psychophysiologic insomnia: Secondary | ICD-10-CM

## 2014-03-26 DIAGNOSIS — G4700 Insomnia, unspecified: Secondary | ICD-10-CM

## 2014-03-26 DIAGNOSIS — K219 Gastro-esophageal reflux disease without esophagitis: Secondary | ICD-10-CM

## 2014-03-26 DIAGNOSIS — F988 Other specified behavioral and emotional disorders with onset usually occurring in childhood and adolescence: Secondary | ICD-10-CM

## 2014-03-26 DIAGNOSIS — F411 Generalized anxiety disorder: Secondary | ICD-10-CM

## 2014-03-26 MED ORDER — ONDANSETRON 8 MG PO TBDP: 8.0000 mg | ORAL_TABLET | Freq: Three times a day (TID) | ORAL | Status: DC | PRN

## 2014-03-26 MED ORDER — LISDEXAMFETAMINE DIMESYLATE 70 MG PO CAPS: 70.0000 mg | ORAL_CAPSULE | ORAL | Status: DC

## 2014-03-26 MED ORDER — ALPRAZOLAM 1 MG PO TABS: 1.0000 mg | ORAL_TABLET | Freq: Two times a day (BID) | ORAL | Status: DC | PRN

## 2014-04-01 ENCOUNTER — Encounter: Payer: Self-pay | Admitting: Nurse Practitioner

## 2014-04-01 NOTE — Progress Notes (Signed)
Subjective:  Presents for recheck. Has been under more stress lately.  Continues to see psychologist for counseling. Work continues to be more stressful. Patient does not consider any other profession; states she loves her job. Has a fiance in [place]. Plans to move eventually. No suicidal or homicidal thoughts or ideation. Klonopin not working as well. Review of chart shows patient has taken Xanax without difficulty in the past. Some nausea with decreased appetite. No abd pain. BMs nl. Taking Protonix daily. Vyvanse working well for her ADD. Continues to have chronic insomnia, no relief with Klonopin.  Objective:   BP 132/88  Ht 5\' 9"  (1.753 m)  Wt 233 lb (105.688 kg)  BMI 34.39 kg/m2 NAD. Alert, oriented. Thoughts logical, coherent and relevant. Mildly anxious affect. Lungs clear. Heart RRR. Abdomen soft nondistended nontender.  Assessment:  Problem List Items Addressed This Visit     Digestive   GERD (gastroesophageal reflux disease) - Primary   Relevant Medications      ondansetron (ZOFRAN-ODT) disintegrating tablet     Endocrine   Hypothyroidism   Relevant Orders      TSH     Other   Chronic insomnia   ADD (attention deficit disorder)   Anxiety state, unspecified   Relevant Medications      ALPRAZolam Prudy Feeler) tablet     Plan:  Meds ordered this encounter  Medications  . ondansetron (ZOFRAN-ODT) 8 MG disintegrating tablet    Sig: Take 1 tablet (8 mg total) by mouth every 8 (eight) hours as needed for nausea or vomiting.    Dispense:  30 tablet    Refill:  0    Order Specific Question:  Supervising Provider    Answer:  Merlyn Albert [[id number]]  . DISCONTD: lisdexamfetamine (VYVANSE) 70 MG capsule    Sig: Take 1 capsule (70 mg total) by mouth every morning.    Dispense:  30 capsule    Refill:  0    Order Specific Question:  Supervising Provider    Answer:  Merlyn Albert [[id number]]  . DISCONTD: lisdexamfetamine (VYVANSE) 70 MG capsule    Sig: Take 1 capsule (70 mg  total) by mouth every morning.    Dispense:  30 capsule    Refill:  0    May fill 30 days from 03/26/14    Order Specific Question:  Supervising Provider    Answer:  Merlyn Albert [[id number]]  . lisdexamfetamine (VYVANSE) 70 MG capsule    Sig: Take 1 capsule (70 mg total) by mouth every morning.    Dispense:  30 capsule    Refill:  0    May fill 60 days from 03/26/14    Order Specific Question:  Supervising Provider    Answer:  Merlyn Albert [[id number]]  . ALPRAZolam (XANAX) 1 MG tablet    Sig: Take 1 tablet (1 mg total) by mouth 2 (two) times daily as needed for anxiety.    Dispense:  30 tablet    Refill:  2    May refill monthly    Order Specific Question:  Supervising Provider    Answer:  Riccardo Dubin   Strongly recommend psychiatric consultation due to increasing severity of patient's anxiety. Will switch to Xanax. Patient to seek help immediately if symptoms worsen. Return in about 3 months (around 06/26/2014).

## 2014-07-31 ENCOUNTER — Telehealth: Payer: Self-pay | Admitting: Family Medicine

## 2014-07-31 NOTE — Telephone Encounter (Signed)
Ok, may release with pts request
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
## 2014-08-06 ENCOUNTER — Ambulatory Visit (INDEPENDENT_AMBULATORY_CARE_PROVIDER_SITE_OTHER): Payer: 59 | Admitting: Family Medicine

## 2014-08-06 ENCOUNTER — Encounter: Payer: Self-pay | Admitting: Family Medicine

## 2014-08-06 VITALS — BP 122/88 | Ht 69.0 in | Wt 229.0 lb

## 2014-08-06 DIAGNOSIS — E039 Hypothyroidism, unspecified: Secondary | ICD-10-CM

## 2014-08-06 DIAGNOSIS — F5104 Psychophysiologic insomnia: Secondary | ICD-10-CM

## 2014-08-06 DIAGNOSIS — G4700 Insomnia, unspecified: Secondary | ICD-10-CM

## 2014-08-06 DIAGNOSIS — F988 Other specified behavioral and emotional disorders with onset usually occurring in childhood and adolescence: Secondary | ICD-10-CM

## 2014-08-06 DIAGNOSIS — K219 Gastro-esophageal reflux disease without esophagitis: Secondary | ICD-10-CM

## 2014-08-06 DIAGNOSIS — F909 Attention-deficit hyperactivity disorder, unspecified type: Secondary | ICD-10-CM

## 2014-08-06 MED ORDER — BUPROPION HCL ER (XL) 300 MG PO TB24: ORAL_TABLET | ORAL | Status: AC

## 2014-08-06 MED ORDER — ALPRAZOLAM 1 MG PO TABS: 1.0000 mg | ORAL_TABLET | Freq: Two times a day (BID) | ORAL | Status: DC | PRN

## 2014-08-06 MED ORDER — LEVOTHYROXINE SODIUM 137 MCG PO TABS: ORAL_TABLET | ORAL | Status: AC

## 2014-08-06 MED ORDER — LISDEXAMFETAMINE DIMESYLATE 70 MG PO CAPS: 70.0000 mg | ORAL_CAPSULE | ORAL | Status: AC

## 2014-08-06 MED ORDER — LAMOTRIGINE 100 MG PO TABS: ORAL_TABLET | ORAL | Status: AC

## 2014-08-06 MED ORDER — LISDEXAMFETAMINE DIMESYLATE 70 MG PO CAPS: 70.0000 mg | ORAL_CAPSULE | ORAL | Status: DC

## 2014-08-06 MED ORDER — ZOLPIDEM TARTRATE 10 MG PO TABS: 10.0000 mg | ORAL_TABLET | Freq: Every evening | ORAL | Status: DC | PRN

## 2014-08-06 MED ORDER — MUPIROCIN 2 % EX OINT: 1.0000 "application " | TOPICAL_OINTMENT | Freq: Two times a day (BID) | CUTANEOUS | Status: AC

## 2014-08-06 MED ORDER — PANTOPRAZOLE SODIUM 40 MG PO TBEC: 40.0000 mg | DELAYED_RELEASE_TABLET | Freq: Every day | ORAL | Status: AC
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
## 2014-08-27 ENCOUNTER — Ambulatory Visit (INDEPENDENT_AMBULATORY_CARE_PROVIDER_SITE_OTHER): Payer: 59 | Admitting: Family Medicine

## 2014-08-27 ENCOUNTER — Ambulatory Visit (INDEPENDENT_AMBULATORY_CARE_PROVIDER_SITE_OTHER): Payer: 59

## 2014-08-27 VITALS — BP 126/78 | HR 99 | Temp 98.5°F | Resp 16 | Ht 68.75 in | Wt 230.0 lb

## 2014-08-27 DIAGNOSIS — N926 Irregular menstruation, unspecified: Secondary | ICD-10-CM

## 2014-08-27 DIAGNOSIS — R0789 Other chest pain: Secondary | ICD-10-CM

## 2014-08-27 LAB — POCT CBC
Granulocyte percent: 57.1 %G (ref 37–80)
HEMATOCRIT: 38.1 % (ref 37.7–47.9)
HEMOGLOBIN: 12.3 g/dL (ref 12.2–16.2)
LYMPH, POC: 2.9 (ref 0.6–3.4)
MCH, POC: 30.8 pg (ref 27–31.2)
MCHC: 32.2 g/dL (ref 31.8–35.4)
MCV: 95.4 fL (ref 80–97)
MID (CBC): 0.7 (ref 0–0.9)
MPV: 7.5 fL (ref 0–99.8)
POC Granulocyte: 4.7 (ref 2–6.9)
POC LYMPH %: 34.9 % (ref 10–50)
POC MID %: 8 % (ref 0–12)
Platelet Count, POC: 347 10*3/uL (ref 142–424)
RBC: 3.99 M/uL — AB (ref 4.04–5.48)
RDW, POC: 14.5 %
WBC: 8.3 10*3/uL (ref 4.6–10.2)

## 2014-08-27 LAB — TROPONIN I

## 2014-08-27 LAB — POCT URINE PREGNANCY: Preg Test, Ur: NEGATIVE

## 2014-08-27 MED ORDER — GI COCKTAIL ~~LOC~~: 30.0000 mL | Freq: Once | ORAL | Status: AC

## 2014-08-27 NOTE — Progress Notes (Signed)
[facility] [address], [place] [id number] [phone]  Date:  08/27/2014   Name:  Tammie Rodriguez   DOB:  08-Aug-1976   MRN:  [id number]  PCP:  Harlow AsaLUKING,W S, MD    Chief Complaint: Chest Pain   History of Present Illness:  Tammie Rodriguez is a 38 y.o. very pleasant female patient who presents with the following:  She has noted some substernal CP for about 3 days. Also her back feels "tight." The pain has been persistent and does not seem to vary with activity.  She does have a history of mitral valve prolapse dx a long time ago.  Otherwise she does not have any history of heart issues.   No SOB. She feels most uncomfortable when she is laying down.   She has not noted a fever or cough, but she does have a scratchy throat.   She is here with her new husband- they actually just got married last night.   She does have a history of GERD, and is taking protonix.  Her GM had heart disease- CHF- she died at age 37.  Otherwise no family history of cardiac disease as far as she knows.   She has never been a smoker.  Does not use drugs  She is a Emergency planning/management officerpolice officer.   She and her new husband have been trying to conceive.  She notes that she had a light period last month that lasted just 3 days instead of her usual 6. Then 2 days ago she seemed to pass a large amount of blood "and tissue."  Just spotting now, no abdominal pain.   Patient Active Problem List   Diagnosis Date Noted  . Hypothyroidism 03/26/2014  . Anxiety state, unspecified 12/16/2013  . ADD (attention deficit disorder) 04/12/2013  . Morbid obesity 04/12/2013  . Chronic insomnia 03/14/2013  . Depression 03/14/2013  . Hypothyroid 01/03/2012  . GERD (gastroesophageal reflux disease) 01/03/2012    Past Medical History  Diagnosis Date  . Thyroid disease   . ADHD (attention deficit hyperactivity disorder)   . Acid reflux   . Depression   . Anxiety   . Bipolar disorder   . Obesity   . Reflux     Past  Surgical History  Procedure Laterality Date  . Thumb arthroscopy    . Ligament repair  2007    History  Substance Use Topics  . Smoking status: Never Smoker   . Smokeless tobacco: Not on file  . Alcohol Use: Yes    Family History  Problem Relation Age of Onset  . Heart failure Maternal Grandmother   . Diabetes Maternal Grandmother   . Hypertension Mother     Allergies  Allergen Reactions  . Augmentin [Amoxicillin-Pot Clavulanate] Nausea And Vomiting  . Adderall Xr [Amphetamine-Dextroamphet Er] Other (See Comments)    Irritability; mood swings  . Amoxicillin Nausea And Vomiting  . Penicillins Nausea And Vomiting    Medication list has been reviewed and updated.  Current Outpatient Prescriptions on File Prior to Visit  Medication Sig Dispense Refill  . ALPRAZolam (XANAX) 1 MG tablet Take 1 tablet (1 mg total) by mouth 2 (two) times daily as needed for anxiety. 30 tablet 3  . buPROPion (WELLBUTRIN XL) 300 MG 24 hr tablet TAKE 1 TABLET BY MOUTH DAILY 30 tablet 3  . dicyclomine (BENTYL) 10 MG capsule Take 1 capsule (10 mg total) by mouth 4 (four) times daily -  before meals and at bedtime. Prn  abd spasms 40 capsule 2  . lamoTRIgine (LAMICTAL) 100 MG tablet TAKE 1 TABLET BY MOUTH EVERY NIGHT AT BEDTIME 30 tablet 3  . levothyroxine (SYNTHROID, LEVOTHROID) 137 MCG tablet TAKE ONE TABLET BY MOUTH DAILY FOR THYROID 30 tablet 3  . lisdexamfetamine (VYVANSE) 70 MG capsule Take 1 capsule (70 mg total) by mouth every morning. 30 capsule 0  . mupirocin ointment (BACTROBAN) 2 % Place 1 application into the nose 2 (two) times daily. 30 g 0  . pantoprazole (PROTONIX) 40 MG tablet Take 1 tablet (40 mg total) by mouth daily. Prn abd pain or acid reflux 30 tablet 3  . zolpidem (AMBIEN) 10 MG tablet Take 1 tablet (10 mg total) by mouth at bedtime as needed for sleep. 30 tablet 3   No current facility-administered medications on file prior to visit.    Review of Systems:  As per HPI-  otherwise negative.   Physical Examination: Filed Vitals:   08/27/14 1539  BP: 126/78  Pulse: 99  Temp: 98.5 F (36.9 C)  Resp: 16   Filed Vitals:   08/27/14 1539  Height: 5' 8.75" (1.746 m)  Weight: 230 lb (104.327 kg)   Body mass index is 34.22 kg/(m^2). Ideal Body Weight: Weight in (lb) to have BMI = 25: 167.7  GEN: WDWN, NAD, Non-toxic, A & O x 3, larger build, looks well HEENT: Atraumatic, Normocephalic. Neck supple. No masses, No LAD.  Bilateral TM wnl, oropharynx normal.  PEERL,EOMI.   Ears and Nose: No external deformity. CV: RRR, No M/G/R. No JVD. No thrill. No extra heart sounds. PULM: CTA B, no wheezes, crackles, rhonchi. No retractions. No resp. distress. No accessory muscle use. ABD: S, NT, ND, +BS. No rebound. No HSM. EXTR: No c/c/e NEURO Normal gait.  PSYCH: Normally interactive. Conversant. Not depressed or anxious appearing.  Calm demeanor.  Able to reproduce her CP by pressing over her sternum and pectoralis muscles  Results for orders placed or performed in visit on 08/27/14  POCT urine pregnancy  Result Value Ref Range   Preg Test, Ur Negative    GI cocktail: improved her sx by "40 percent"   [facility] reading (PRIMARY) by  Dr. Patsy Lager. CXR:  Negative CHEST 2 VIEW  COMPARISON: 05/24/2013  FINDINGS: The heart size and mediastinal contours are within normal limits. Both lungs are clear. The visualized skeletal structures are unremarkable.  IMPRESSION: No active cardiopulmonary disease.  EKG: normal SR, rate 90 BPM  Pulse Readings from Last 3 Encounters:  08/27/14 99  08/11/13 71  05/14/13 90   Results for orders placed or performed in visit on 08/27/14  Troponin I  Result Value Ref Range   Troponin I <0.01 <0.06 ng/mL  POCT urine pregnancy  Result Value Ref Range   Preg Test, Ur Negative   POCT CBC  Result Value Ref Range   WBC 8.3 4.6 - 10.2 K/uL   Lymph, poc 2.9 0.6 - 3.4   POC LYMPH PERCENT 34.9 10 - 50 %L   MID (cbc) 0.7 0 -  0.9   POC MID % 8.0 0 - 12 %M   POC Granulocyte 4.7 2 - 6.9   Granulocyte percent 57.1 37 - 80 %G   RBC 3.99 (A) 4.04 - 5.48 M/uL   Hemoglobin 12.3 12.2 - 16.2 g/dL   HCT, POC 16.1 09.6 - 47.9 %   MCV 95.4 80 - 97 fL   MCH, POC 30.8 27 - 31.2 pg   MCHC 32.2 31.8 - 35.4 g/dL  RDW, POC 14.5 %   Platelet Count, POC 347 142 - 424 K/uL   MPV 7.5 0 - 99.8 fL   Assessment and Plan: Other chest pain - Plan: DG Chest 2 View, EKG 12-Lead, gi cocktail (Maalox,Lidocaine,Donnatal), POCT CBC, Troponin I  Abnormal menses - Plan: POCT urine pregnancy  Here today with chest pain for about 3 days.  It is reproducible on palpation of the chest wall and reduced with GI cocktail.  Doubt cardiac etiology but explained that I cannot totally rule this out in the office. Offered ED evaluation, which she declined.  However she did agree to have a troponin drawn.  Called her later in the evening once troponin test in and let her know it is negative.  She is reassured and will seek care if her sx persist. Right away if worse.    Irregular menses.  She was concerned that her recent bleeding pattern could indicate a miscarriage.  Reassured her that this is unlikely as her urine HCG is negative; this is generally positive for at least 2 weeks after a MC.   Encouraged her to start PNV now as she and her husband would like to conceive.    Signed Abbe AmsterdamJessica Achillies Buehl, MD

## 2014-08-27 NOTE — Patient Instructions (Signed)
I suspect that your chest pain is caused by chest wall pan (muscle soreness) and some reflux.  Try adding a dose of OTC zantac to your regimen daily for a couple of weeks You can also use some tylenol as needed.  I would avoid NSAID medications as they may irritate your stomach I will give you a call later on today with your troponin result.   If your symptoms change or get worse go to the ER

## 2014-10-10 ENCOUNTER — Ambulatory Visit
Admission: RE | Admit: 2014-10-10 | Discharge: 2014-10-10 | Disposition: A | Discharge status: Home / Self Care | Payer: 59 | Source: Ambulatory Visit | Attending: Emergency Medicine | Admitting: Emergency Medicine

## 2014-10-10 ENCOUNTER — Telehealth: Payer: Self-pay | Admitting: Radiology

## 2014-10-10 ENCOUNTER — Ambulatory Visit (INDEPENDENT_AMBULATORY_CARE_PROVIDER_SITE_OTHER): Payer: 59 | Admitting: Emergency Medicine

## 2014-10-10 VITALS — BP 118/70 | HR 99 | Temp 99.0°F | Resp 18 | Ht 69.0 in | Wt 232.0 lb

## 2014-10-10 DIAGNOSIS — R1031 Right lower quadrant pain: Secondary | ICD-10-CM

## 2014-10-10 DIAGNOSIS — R112 Nausea with vomiting, unspecified: Secondary | ICD-10-CM

## 2014-10-10 LAB — POCT CBC
Granulocyte percent: 73.2 %G (ref 37–80)
HCT, POC: 37.7 % (ref 37.7–47.9)
Hemoglobin: 12.4 g/dL (ref 12.2–16.2)
LYMPH, POC: 2 (ref 0.6–3.4)
MCH, POC: 30.7 pg (ref 27–31.2)
MCHC: 32.8 g/dL (ref 31.8–35.4)
MCV: 93.7 fL (ref 80–97)
MID (CBC): 0.5 (ref 0–0.9)
MPV: 7 fL (ref 0–99.8)
PLATELET COUNT, POC: 330 10*3/uL (ref 142–424)
POC Granulocyte: 6.7 (ref 2–6.9)
POC LYMPH %: 21.5 % (ref 10–50)
POC MID %: 5.3 %M (ref 0–12)
RBC: 4.02 M/uL — AB (ref 4.04–5.48)
RDW, POC: 13.9 %
WBC: 9.1 10*3/uL (ref 4.6–10.2)

## 2014-10-10 LAB — POCT UA - MICROSCOPIC ONLY
Bacteria, U Microscopic: NEGATIVE
CRYSTALS, UR, HPF, POC: NEGATIVE
Casts, Ur, LPF, POC: NEGATIVE
Mucus, UA: NEGATIVE
YEAST UA: NEGATIVE

## 2014-10-10 LAB — POCT URINALYSIS DIPSTICK
Bilirubin, UA: NEGATIVE
Glucose, UA: NEGATIVE
KETONES UA: NEGATIVE
Leukocytes, UA: NEGATIVE
NITRITE UA: NEGATIVE
PH UA: 6
PROTEIN UA: NEGATIVE
Spec Grav, UA: 1.02
Urobilinogen, UA: 0.2

## 2014-10-10 LAB — POCT URINE PREGNANCY: Preg Test, Ur: NEGATIVE

## 2014-10-10 MED ORDER — ONDANSETRON 4 MG PO TBDP
8.0000 mg | ORAL_TABLET | Freq: Once | ORAL | Status: AC
  Administered 2014-10-10: 8 mg via ORAL

## 2014-10-10 MED ORDER — IOHEXOL 300 MG/ML  SOLN
125.0000 mL | Freq: Once | INTRAMUSCULAR | Status: AC | PRN
  Administered 2014-10-10: 125 mL via INTRAVENOUS

## 2014-10-10 MED ORDER — ONDANSETRON 8 MG PO TBDP: 8.0000 mg | ORAL_TABLET | Freq: Three times a day (TID) | ORAL | Status: AC | PRN

## 2014-10-10 NOTE — Progress Notes (Addendum)
Subjective:  This chart was scribed for Earl Lites, MD by Haywood Pao, ED Scribe at [facility].The patient was seen in exam room 08 and the patient's care was started at 9:47 AM.   Patient ID: Tammie Rodriguez, female    DOB: 1976-05-01, 38 y.o.   MRN: [id number] Chief Complaint  Patient presents with  . Emesis    yesterday   . Diarrhea   HPI HPI Comments: Tammie Rodriguez is a 38 y.o. female who presents to [facility] complaining of vomiting onset today and diarrhea onset 1 day ago. Pt has taken OTC medication for her diarrhea which has provided relief. Pt began throwing up at 2 AM this morning and has thrown up about 4-5 times since onset. Pt feels dehydrated and weak since this morning. She denies fever, sore throat, blood in stool, pt has not taken abx recently.  Pt did travel to [place] recently, has a history of hypothyroidism. 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 She has not had any abdominal surgeries. Patient Active Problem List   Diagnosis Date Noted  . Hypothyroidism 03/26/2014  . Anxiety state, unspecified 12/16/2013  . ADD (attention deficit disorder) 04/12/2013  . Morbid obesity 04/12/2013  . Chronic insomnia 03/14/2013  . Depression 03/14/2013  . Hypothyroid 01/03/2012  . GERD (gastroesophageal reflux disease) 01/03/2012   Past Medical History  Diagnosis Date  . Thyroid disease   . ADHD (attention deficit hyperactivity disorder)   . Acid reflux   . Depression   . Anxiety   . Bipolar disorder   . Obesity   . Reflux    Past Surgical History  Procedure Laterality Date  . Thumb arthroscopy    . Ligament repair  2007   Allergies  Allergen Reactions  . Augmentin [Amoxicillin-Pot Clavulanate] Nausea And Vomiting  . Adderall Xr [Amphetamine-Dextroamphet Er] Other (See Comments)    Irritability; mood swings  .  Amoxicillin Nausea And Vomiting  . Penicillins Nausea And Vomiting   Prior to Admission medications   Medication Sig Start Date End Date Taking? Authorizing Provider  ALPRAZolam Prudy Feeler) 1 MG tablet Take 1 tablet (1 mg total) by mouth 2 (two) times daily as needed for anxiety. 08/06/14  Yes Merlyn Albert, MD  buPROPion (WELLBUTRIN XL) 300 MG 24 hr tablet TAKE 1 TABLET BY MOUTH DAILY 08/06/14  Yes Merlyn Albert, MD  dicyclomine (BENTYL) 10 MG capsule Take 1 capsule (10 mg total) by mouth 4 (four) times daily -  before meals and at bedtime. Prn abd spasms 08/30/13  Yes Campbell Riches, NP  lamoTRIgine (LAMICTAL) 100 MG tablet TAKE 1 TABLET BY MOUTH EVERY NIGHT AT BEDTIME 08/06/14  Yes Merlyn Albert, MD  levothyroxine (SYNTHROID, LEVOTHROID) 137 MCG tablet TAKE ONE TABLET BY MOUTH DAILY FOR THYROID 08/06/14  Yes Merlyn Albert, MD  lisdexamfetamine (VYVANSE) 70 MG capsule Take 1 capsule (70 mg total) by mouth every morning. 08/06/14  Yes Merlyn Albert, MD  mupirocin ointment (BACTROBAN) 2 % Place 1 application into the nose 2 (two) times daily. 08/06/14  Yes Merlyn Albert, MD  pantoprazole (PROTONIX) 40 MG tablet Take 1 tablet (40 mg total) by mouth daily. Prn abd pain or acid reflux 08/06/14  Yes Merlyn Albert, MD  zolpidem (AMBIEN) 10 MG tablet Take 1 tablet (10 mg total) by  mouth at bedtime as needed for sleep. 08/06/14  Yes Merlyn AlbertWilliam S Luking, MD   History   Social History  . Marital Status: Divorced    Spouse Name: N/A    Number of Children: N/A  . Years of Education: N/A   Occupational History  . Not on file.   Social History Main Topics  . Smoking status: Never Smoker   . Smokeless tobacco: Not on file  . Alcohol Use: Yes  . Drug Use: No  . Sexual Activity: Yes    Birth Control/ Protection: None   Other Topics Concern  . Not on file   Social History Narrative   Review of Systems  Constitutional: Positive for fatigue. Negative for fever.  HENT: Negative  for sore throat.   Gastrointestinal: Positive for vomiting, abdominal pain and diarrhea. Negative for blood in stool.  Genitourinary: Positive for hematuria.      Objective:  BP 118/70 mmHg  Pulse 99  Temp(Src) 99 F (37.2 C) (Oral)  Resp 18  Ht 5\' 9"  (1.753 m)  Wt 232 lb (105.235 kg)  BMI 34.24 kg/m2  SpO2 99%  LMP 10/06/2014  Physical Exam  Constitutional: She is oriented to person, place, and time. She appears well-developed and well-nourished. No distress.  HENT:  Head: Normocephalic and atraumatic.  Mouth/Throat: Mucous membranes are normal.  Mucous membranes are moist.  Eyes: EOM are normal.  Neck: Normal range of motion.  Cardiovascular: Normal rate.   Pulmonary/Chest: Effort normal.  Abdominal:  Abdomen is obese Tenderness in RLQ.  Neurological: She is alert and oriented to person, place, and time.  Skin: Skin is warm and dry.  Psychiatric: She has a normal mood and affect. Her behavior is normal.  Nursing note and vitals reviewed.  Results for orders placed or performed in visit on 10/10/14  POCT CBC  Result Value Ref Range   WBC 9.1 4.6 - 10.2 K/uL   Lymph, poc 2.0 0.6 - 3.4   POC LYMPH PERCENT 21.5 10 - 50 %L   MID (cbc) 0.5 0 - 0.9   POC MID % 5.3 0 - 12 %M   POC Granulocyte 6.7 2 - 6.9   Granulocyte percent 73.2 37 - 80 %G   RBC 4.02 (A) 4.04 - 5.48 M/uL   Hemoglobin 12.4 12.2 - 16.2 g/dL   HCT, POC 86.537.7 78.437.7 - 47.9 %   MCV 93.7 80 - 97 fL   MCH, POC 30.7 27 - 31.2 pg   MCHC 32.8 31.8 - 35.4 g/dL   RDW, POC 69.613.9 %   Platelet Count, POC 330 142 - 424 K/uL   MPV 7.0 0 - 99.8 fL  POCT UA - Microscopic Only  Result Value Ref Range   WBC, Ur, HPF, POC 0-1    RBC, urine, microscopic tntc    Bacteria, U Microscopic neg    Mucus, UA neg    Epithelial cells, urine per micros 3-6    Crystals, Ur, HPF, POC neg    Casts, Ur, LPF, POC neg    Yeast, UA neg   POCT urinalysis dipstick  Result Value Ref Range   Color, UA yellow    Clarity, UA clear     Glucose, UA neg    Bilirubin, UA neg    Ketones, UA neg    Spec Grav, UA 1.020    Blood, UA moderate    pH, UA 6.0    Protein, UA neg    Urobilinogen, UA 0.2    Nitrite,  UA neg    Leukocytes, UA Negative   POCT urine pregnancy  Result Value Ref Range   Preg Test, Ur Negative    Results for orders placed or performed in visit on 10/10/14  POCT CBC  Result Value Ref Range   WBC 9.1 4.6 - 10.2 K/uL   Lymph, poc 2.0 0.6 - 3.4   POC LYMPH PERCENT 21.5 10 - 50 %L   MID (cbc) 0.5 0 - 0.9   POC MID % 5.3 0 - 12 %M   POC Granulocyte 6.7 2 - 6.9   Granulocyte percent 73.2 37 - 80 %G   RBC 4.02 (A) 4.04 - 5.48 M/uL   Hemoglobin 12.4 12.2 - 16.2 g/dL   HCT, POC 96.037.7 45.437.7 - 47.9 %   MCV 93.7 80 - 97 fL   MCH, POC 30.7 27 - 31.2 pg   MCHC 32.8 31.8 - 35.4 g/dL   RDW, POC 09.813.9 %   Platelet Count, POC 330 142 - 424 K/uL   MPV 7.0 0 - 99.8 fL  POCT UA - Microscopic Only  Result Value Ref Range   WBC, Ur, HPF, POC 0-1    RBC, urine, microscopic tntc    Bacteria, U Microscopic neg    Mucus, UA neg    Epithelial cells, urine per micros 3-6    Crystals, Ur, HPF, POC neg    Casts, Ur, LPF, POC neg    Yeast, UA neg   POCT urinalysis dipstick  Result Value Ref Range   Color, UA yellow    Clarity, UA clear    Glucose, UA neg    Bilirubin, UA neg    Ketones, UA neg    Spec Grav, UA 1.020    Blood, UA moderate    pH, UA 6.0    Protein, UA neg    Urobilinogen, UA 0.2    Nitrite, UA neg    Leukocytes, UA Negative   POCT urine pregnancy  Result Value Ref Range   Preg Test, Ur Negative       Assessment & Plan:   She does have moderate blood in her urine however she is on her menstrual cycle. Pregnancy test is negative. White count is upper limits of normal and in the presence of right lower quadrant pain with low-grade fever I do think she should be scanning. will arrange for CT abdomen pelvis with contrast.I personally performed the services described in this documentation, which was  scribed in my presence. The recorded information has been reviewed and is accurate. Patient received 1 L of IV fluids in the office and initially felt improved however her nausea returned and arrangements are being made for her to have a CT abdomen pelvis with contrast. CT shows a 4 x 2.2 cystic right ovarian mass most likely a cyst. Will proceed with evaluation of this by ultrasound and [facility].

## 2014-10-10 NOTE — Telephone Encounter (Signed)
Set up US (pelvic and trans vaginal) at [facility] on Wednesday 1:15 pm  Patient informed.  Patient will go to ER if abdominal pain worsens or begins to vomit.

## 2014-10-10 NOTE — Telephone Encounter (Signed)
[facility] is [facility] accepting [facility] until January, Toni AmendCourtney will try to get one of her partners in the practice to accept patient.

## 2014-10-15 ENCOUNTER — Ambulatory Visit (HOSPITAL_COMMUNITY): Payer: 59

## 2014-10-17 ENCOUNTER — Emergency Department (HOSPITAL_COMMUNITY): Payer: Worker's Compensation

## 2014-10-17 ENCOUNTER — Emergency Department (HOSPITAL_COMMUNITY)
Admission: EM | Admit: 2014-10-17 | Discharge: 2014-10-17 | Disposition: A | Discharge status: Home / Self Care | Payer: Worker's Compensation | Attending: Emergency Medicine | Admitting: Emergency Medicine

## 2014-10-17 ENCOUNTER — Encounter (HOSPITAL_COMMUNITY): Payer: Self-pay | Admitting: Emergency Medicine

## 2014-10-17 DIAGNOSIS — Y9289 Other specified places as the place of occurrence of the external cause: Secondary | ICD-10-CM | POA: Insufficient documentation

## 2014-10-17 DIAGNOSIS — S93402A Sprain of unspecified ligament of left ankle, initial encounter: Secondary | ICD-10-CM | POA: Diagnosis not present

## 2014-10-17 DIAGNOSIS — E079 Disorder of thyroid, unspecified: Secondary | ICD-10-CM | POA: Diagnosis not present

## 2014-10-17 DIAGNOSIS — E669 Obesity, unspecified: Secondary | ICD-10-CM | POA: Insufficient documentation

## 2014-10-17 DIAGNOSIS — F419 Anxiety disorder, unspecified: Secondary | ICD-10-CM | POA: Insufficient documentation

## 2014-10-17 DIAGNOSIS — W1840XA Slipping, tripping and stumbling without falling, unspecified, initial encounter: Secondary | ICD-10-CM | POA: Diagnosis not present

## 2014-10-17 DIAGNOSIS — Y998 Other external cause status: Secondary | ICD-10-CM | POA: Insufficient documentation

## 2014-10-17 DIAGNOSIS — F909 Attention-deficit hyperactivity disorder, unspecified type: Secondary | ICD-10-CM | POA: Insufficient documentation

## 2014-10-17 DIAGNOSIS — Z880 Allergy status to penicillin: Secondary | ICD-10-CM | POA: Diagnosis not present

## 2014-10-17 DIAGNOSIS — K219 Gastro-esophageal reflux disease without esophagitis: Secondary | ICD-10-CM | POA: Diagnosis not present

## 2014-10-17 DIAGNOSIS — Z79899 Other long term (current) drug therapy: Secondary | ICD-10-CM | POA: Insufficient documentation

## 2014-10-17 DIAGNOSIS — Z792 Long term (current) use of antibiotics: Secondary | ICD-10-CM | POA: Diagnosis not present

## 2014-10-17 DIAGNOSIS — Y9389 Activity, other specified: Secondary | ICD-10-CM | POA: Insufficient documentation

## 2014-10-17 DIAGNOSIS — F319 Bipolar disorder, unspecified: Secondary | ICD-10-CM | POA: Insufficient documentation

## 2014-10-17 DIAGNOSIS — S99912A Unspecified injury of left ankle, initial encounter: Secondary | ICD-10-CM | POA: Diagnosis present

## 2014-10-17 MED ORDER — MELOXICAM 7.5 MG PO TABS: 7.5000 mg | ORAL_TABLET | Freq: Every day | ORAL | Status: AC

## 2014-10-17 MED ORDER — TRAMADOL HCL 50 MG PO TABS: 50.0000 mg | ORAL_TABLET | Freq: Four times a day (QID) | ORAL | Status: DC | PRN

## 2014-10-17 MED ORDER — HYDROCODONE-ACETAMINOPHEN 5-325 MG PO TABS: 1.0000 | ORAL_TABLET | ORAL | Status: AC | PRN

## 2014-10-17 MED ORDER — IBUPROFEN 800 MG PO TABS
800.0000 mg | ORAL_TABLET | Freq: Once | ORAL | Status: AC
Start: 2014-10-17 — End: 2014-10-17
  Administered 2014-10-17: 800 mg via ORAL
  Filled 2014-10-17: qty 1

## 2014-10-17 NOTE — ED Provider Notes (Signed)
CSN: [id number]     Arrival date & time 10/17/14  1358 History  This chart was scribed for Tammie FinnerErin O'Malley, PA-C working with Gwyneth SproutWhitney Plunkett, MD by Evon Slackerrance Branch, ED Scribe. This patient was seen in room WTR8/WTR8 and the patient's care was started at 2:14 PM.    Chief Complaint  Patient presents with  . Ankle Pain   The history is provided by the patient. No language interpreter was used.   HPI Comments: Tammie Rodriguez is a 38 y.o. female who presents to the Emergency Department complaining of left lateral ankle pain onset PTA. Pt states she ahs associated swelling. Pt states she slipped on a curb this morning and rolled her ankle. Pt states she has a Hx of weak ankles and has sprained the ankle several times previously. Pain is 6/10 at worse, aching and throbbing, worse with ambulation.  Pt denies gait problem.    Past Medical History  Diagnosis Date  . Thyroid disease   . ADHD (attention deficit hyperactivity disorder)   . Acid reflux   . Depression   . Anxiety   . Bipolar disorder   . Obesity   . Reflux    Past Surgical History  Procedure Laterality Date  . Thumb arthroscopy    . Ligament repair  2007   Family History  Problem Relation Age of Onset  . Heart failure Maternal Grandmother   . Diabetes Maternal Grandmother   . Hypertension Mother    History  Substance Use Topics  . Smoking status: Never Smoker   . Smokeless tobacco: Not on file  . Alcohol Use: Yes   OB History    No data available     Review of Systems  Musculoskeletal: Positive for joint swelling and arthralgias. Negative for gait problem.    Allergies  Augmentin; Adderall xr; Amoxicillin; and Penicillins  Home Medications   Prior to Admission medications   Medication Sig Start Date End Date Taking? Authorizing Provider  ALPRAZolam Prudy Feeler(XANAX) 1 MG tablet Take 1 tablet (1 mg total) by mouth 2 (two) times daily as needed for anxiety. 08/06/14   Merlyn AlbertWilliam S Luking, MD  buPROPion (WELLBUTRIN XL) 300  MG 24 hr tablet TAKE 1 TABLET BY MOUTH DAILY 08/06/14   Merlyn AlbertWilliam S Luking, MD  dicyclomine (BENTYL) 10 MG capsule Take 1 capsule (10 mg total) by mouth 4 (four) times daily -  before meals and at bedtime. Prn abd spasms 08/30/13   Campbell Richesarolyn C Hoskins, NP  HYDROcodone-acetaminophen (NORCO/VICODIN) 5-325 MG per tablet Take 1-2 tablets by mouth every 4 (four) hours as needed for moderate pain or severe pain. 10/17/14   Tammie FinnerErin O'Malley, PA-C  lamoTRIgine (LAMICTAL) 100 MG tablet TAKE 1 TABLET BY MOUTH EVERY NIGHT AT BEDTIME 08/06/14   Merlyn AlbertWilliam S Luking, MD  levothyroxine (SYNTHROID, LEVOTHROID) 137 MCG tablet TAKE ONE TABLET BY MOUTH DAILY FOR THYROID 08/06/14   Merlyn AlbertWilliam S Luking, MD  lisdexamfetamine (VYVANSE) 70 MG capsule Take 1 capsule (70 mg total) by mouth every morning. 08/06/14   Merlyn AlbertWilliam S Luking, MD  meloxicam (MOBIC) 7.5 MG tablet Take 1 tablet (7.5 mg total) by mouth daily. 10/17/14   Tammie FinnerErin O'Malley, PA-C  mupirocin ointment (BACTROBAN) 2 % Place 1 application into the nose 2 (two) times daily. 08/06/14   Merlyn AlbertWilliam S Luking, MD  ondansetron (ZOFRAN-ODT) 8 MG disintegrating tablet Take 1 tablet (8 mg total) by mouth every 8 (eight) hours as needed for nausea. 10/10/14   Collene GobbleSteven A Daub, MD  pantoprazole (PROTONIX) 40 MG tablet  Take 1 tablet (40 mg total) by mouth daily. Prn abd pain or acid reflux 08/06/14   Merlyn AlbertWilliam S Luking, MD  zolpidem (AMBIEN) 10 MG tablet Take 1 tablet (10 mg total) by mouth at bedtime as needed for sleep. 08/06/14   Merlyn AlbertWilliam S Luking, MD   Triage Vitals: BP 137/96 mmHg  Pulse 116  Temp(Src) 98.4 F (36.9 C) (Oral)  Resp 20  SpO2 98%  LMP 10/10/2014  Physical Exam  Constitutional: She is oriented to person, place, and time. She appears well-developed and well-nourished.  HENT:  Head: Normocephalic and atraumatic.  Eyes: EOM are normal.  Neck: Normal range of motion.  Cardiovascular: Normal rate.   Pulmonary/Chest: Effort normal.  Musculoskeletal: Normal range of motion. She  exhibits edema and tenderness.  Moderate edema to lateral left ankle, mild ecchymosis, pain with plantar flexion against resistance, full ROM of ankle and all 5 toes, pedal pulses 2+, sensation intact, no calf tenderness.   Neurological: She is alert and oriented to person, place, and time.  Skin: Skin is warm and dry.  Psychiatric: She has a normal mood and affect. Her behavior is normal.  Nursing note and vitals reviewed.   ED Course  Procedures (including critical care time) DIAGNOSTIC STUDIES: Oxygen Saturation is 98% on RA, normal by my interpretation.    COORDINATION OF CARE: 2:34 PM-Discussed treatment plan with pt at bedside and pt agreed to plan.     Labs Review Labs Reviewed - No data to display  Imaging Review Dg Ankle Complete Left  10/17/2014   CLINICAL DATA:  Ankle pain, slipped on curb this morning  EXAM: LEFT ANKLE COMPLETE - 3+ VIEW  COMPARISON:  12/18/2009  FINDINGS: Three views of left ankle submitted. No acute fracture or subluxation. Mild soft tissue swelling adjacent to lateral malleolus. Ankle mortise is preserved.  IMPRESSION: No acute fracture or subluxation. Mild soft tissue swelling adjacent to lateral malleolus.   Electronically Signed   By: Natasha MeadLiviu  Pop M.D.   On: 10/17/2014 14:36     EKG Interpretation None      MDM   Final diagnoses:  Left ankle sprain, initial encounter   Pt presenting to ED with c/o left ankle pain that started just PTA after trip over curb.  Left foot is neurovascularly in tact. Plain films: no acute fracture. Pt given ASO ankle splint. Rx: norco and mobic. Home care instructions provided. Advised to f/u with Dr. Ophelia CharterYates, orthopedics, in 1-2 weeks for further evaluation and treatment of ankle sprain. Pt verbalized understanding and agreement with tx plan.   I personally performed the services described in this documentation, which was scribed in my presence. The recorded information has been reviewed and is  accurate.      Tammie Finnerrin O'Malley, PA-C 10/17/14 1556  Gwyneth SproutWhitney Plunkett, MD 10/20/14 [phone]

## 2014-10-17 NOTE — ED Notes (Signed)
Pt slipped on curb this am. L lateral ankle pain. Pt ambulatory.

## 2014-10-17 NOTE — Discharge Instructions (Signed)

## 2014-12-29 ENCOUNTER — Other Ambulatory Visit: Payer: Self-pay | Admitting: Family Medicine

## 2014-12-29 NOTE — Telephone Encounter (Signed)
Ok times one only o v before further

## 2015-08-08 IMAGING — CT CT ABD-PELV W/ CM
2 of 4 series · 17 of 46 positions shown, 19 images · IV contrast (WATER & [ID] OMNI 300)
Comparison: None.

CLINICAL DATA: Nausea, fever, right lower quadrant pain for 2 days

EXAM:
CT ABDOMEN AND PELVIS WITH CONTRAST
TECHNIQUE: Multidetector CT imaging of the abdomen and pelvis was performed
using the standard protocol following bolus administration of
intravenous contrast.
CONTRAST:  125mL OMNIPAQUE IOHEXOL 300 MG/ML  SOLN

[Series 2: abd/pelvis with · axial · 0.86mm/px · z∈[-447,-12]mm · 14 of 95 slices shown, 16 images]
[im 4/95  soft-tissue]
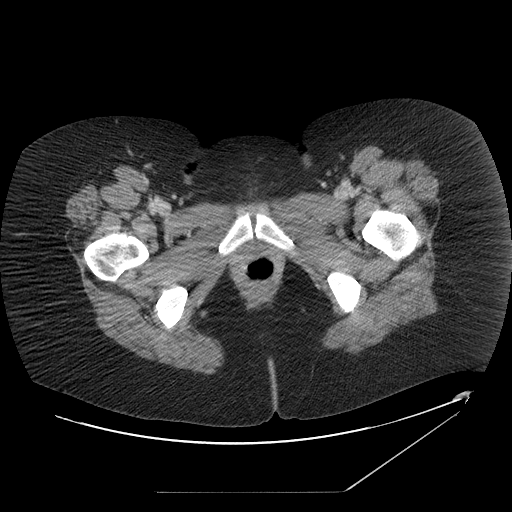
[im 4/95  bone]
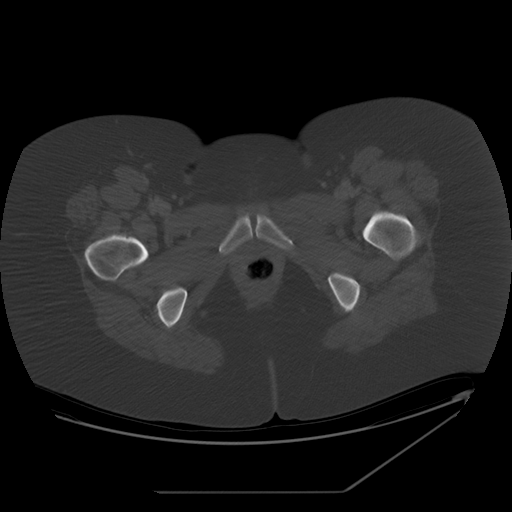
[im 12/95  soft-tissue]
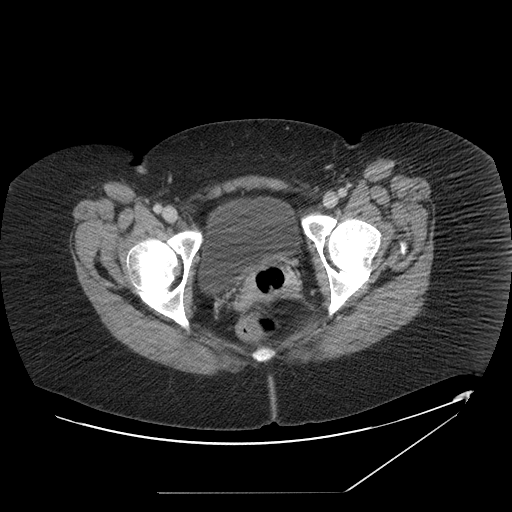
[im 20/95  soft-tissue]
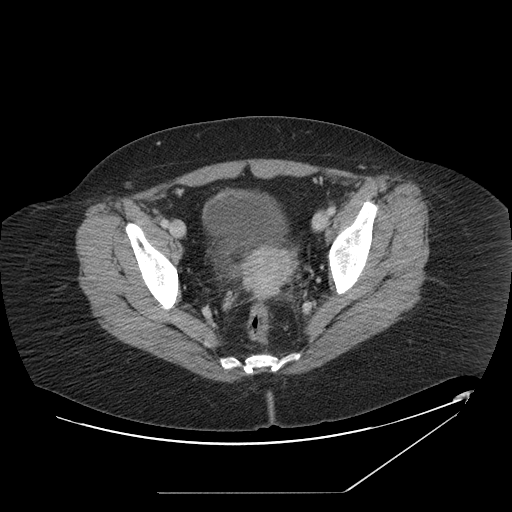
[im 24/95  soft-tissue]
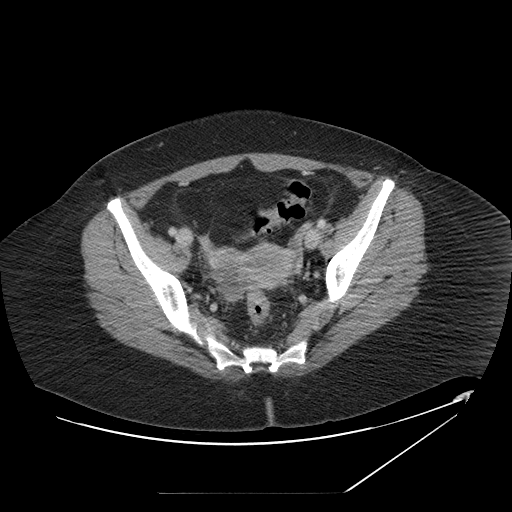
[im 32/95  soft-tissue]
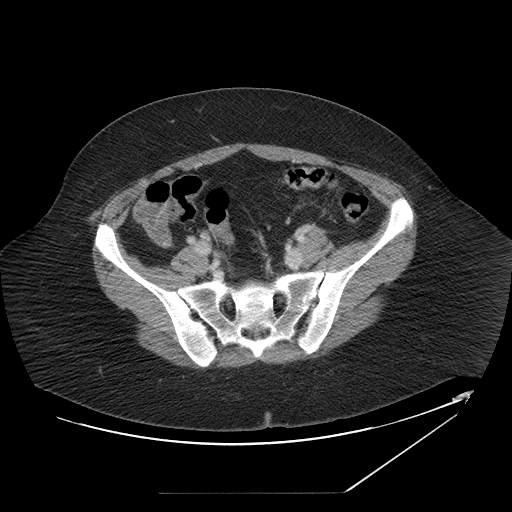
[im 40/95  soft-tissue]
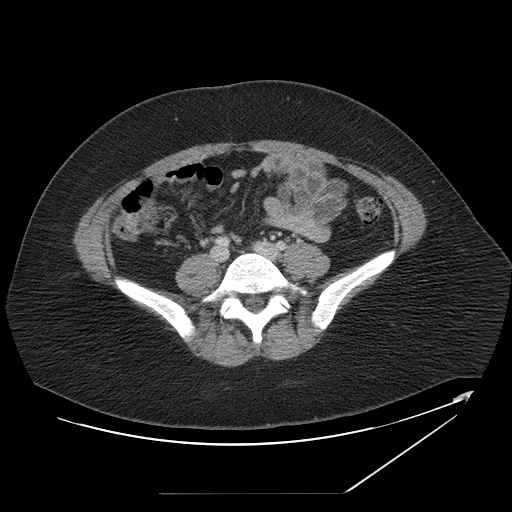
[im 44/95  soft-tissue]
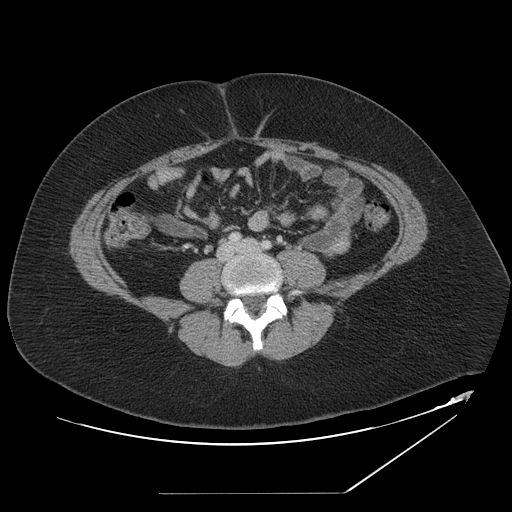
[im 51/95  soft-tissue]
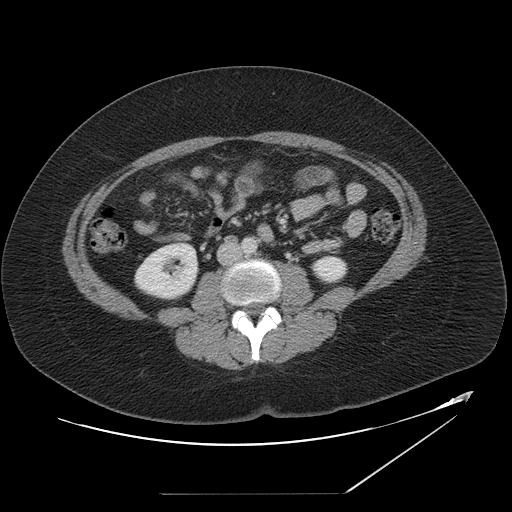
[im 55/95  soft-tissue]
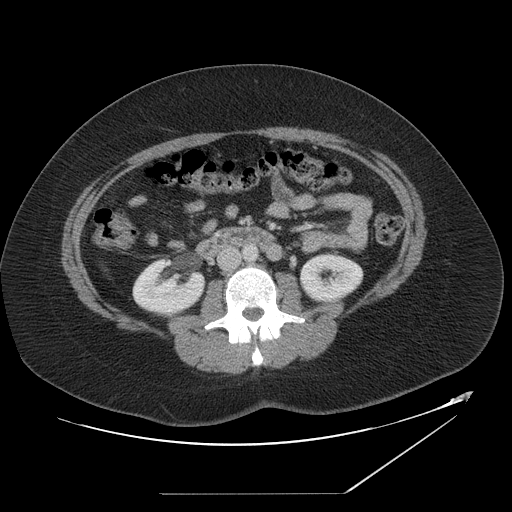
[im 55/95  bone]
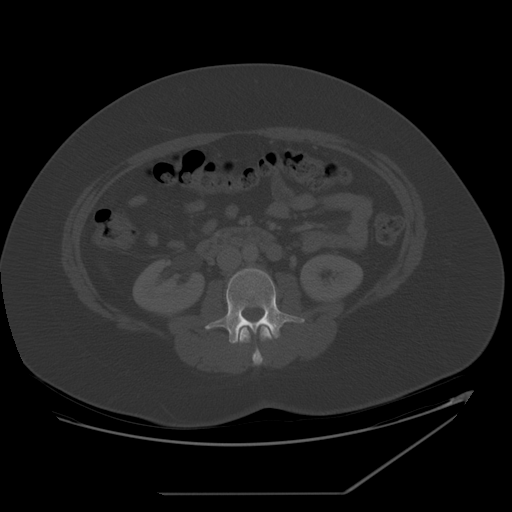
[im 63/95  soft-tissue]
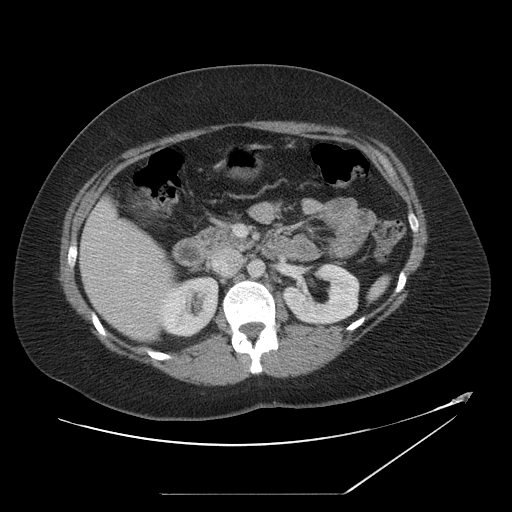
[im 71/95  soft-tissue]
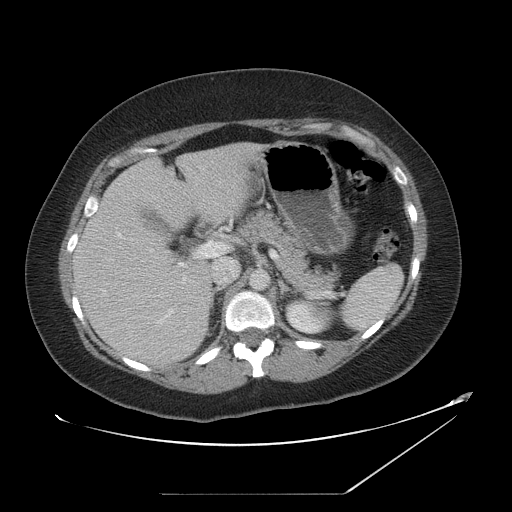
[im 75/95  soft-tissue]
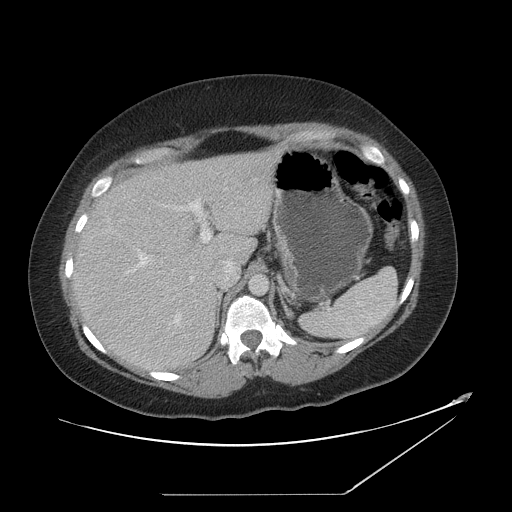
[im 83/95  soft-tissue]
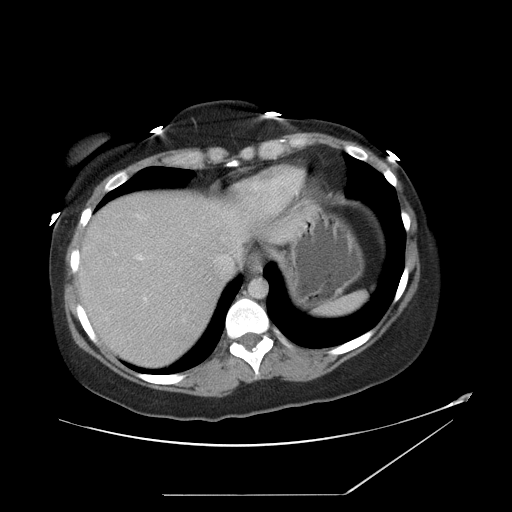
[im 91/95  soft-tissue]
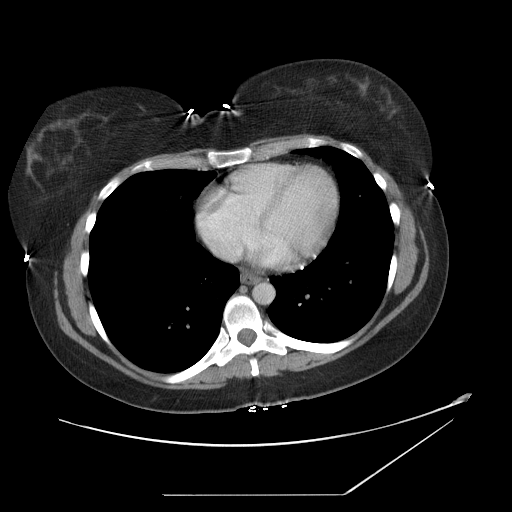

[Series 400: cor · coronal · 1.01mm/px · 3 of 140 slices shown]
[im 47/140  soft-tissue]
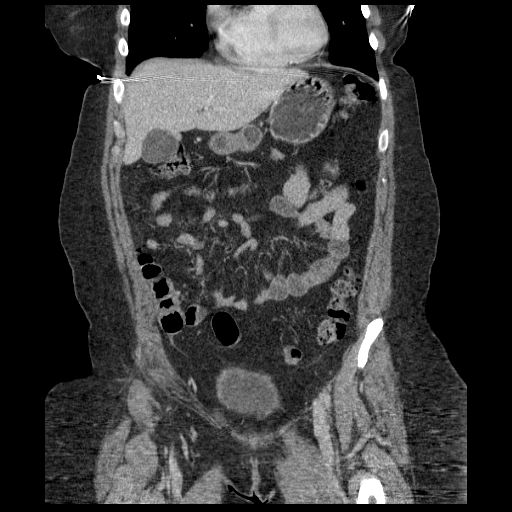
[im 62/140  soft-tissue]
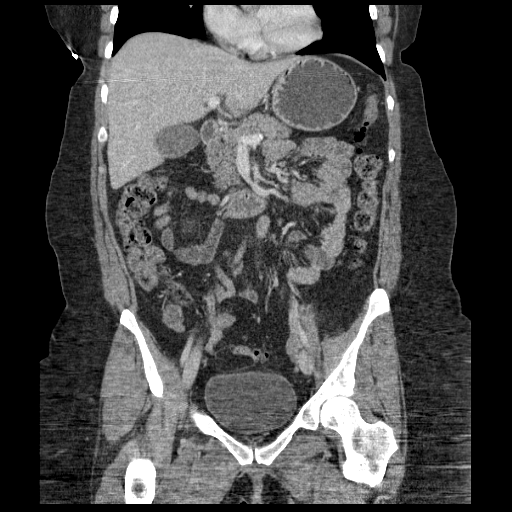
[im 78/140  soft-tissue]
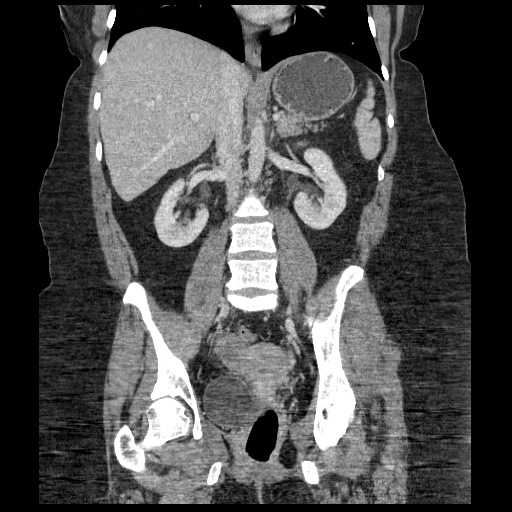

[17 of 46 positions shown; findings below may reference images not displayed]

FINDINGS: The lung bases are clear.

The liver demonstrates no focal abnormality. There is no
intrahepatic or extrahepatic biliary ductal dilatation. The
gallbladder is normal. The spleen demonstrates no focal abnormality.
The kidneys, adrenal glands and pancreas are normal. The bladder is
unremarkable.

The stomach, duodenum, small intestine, and large intestine
demonstrate no contrast extravasation or dilatation. There is a
normal caliber appendix in the right lower quadrant without
periappendiceal inflammatory changes. There is no pneumoperitoneum,
pneumatosis, or portal venous gas. There is no abdominal or pelvic
free fluid. There is no lymphadenopathy. The uterus is unremarkable.
There is a 4 x 2.2 cm cystic right ovarian mass likely representing
a cyst, but incompletely characterized.

The abdominal aorta is normal in caliber .

There are no lytic or sclerotic osseous lesions.
IMPRESSION: 1. Normal appendix.
2. There is a 4 x 2.2 cm cystic right ovarian mass likely
representing a cyst, but incompletely characterized.

## 2018-09-27 ENCOUNTER — Encounter (HOSPITAL_COMMUNITY): Payer: Self-pay | Admitting: Radiology

## 2018-09-27 ENCOUNTER — Emergency Department (HOSPITAL_COMMUNITY)
Admission: EM | Admit: 2018-09-27 | Discharge: 2018-09-28 | Disposition: A | Discharge status: Home / Self Care | Payer: Managed Care, Other (non HMO) | Attending: Emergency Medicine | Admitting: Emergency Medicine

## 2018-09-27 ENCOUNTER — Other Ambulatory Visit: Payer: Self-pay

## 2018-09-27 ENCOUNTER — Emergency Department (HOSPITAL_COMMUNITY): Payer: Managed Care, Other (non HMO)

## 2018-09-27 DIAGNOSIS — J101 Influenza due to other identified influenza virus with other respiratory manifestations: Secondary | ICD-10-CM | POA: Insufficient documentation

## 2018-09-27 DIAGNOSIS — Z7982 Long term (current) use of aspirin: Secondary | ICD-10-CM | POA: Diagnosis not present

## 2018-09-27 DIAGNOSIS — E039 Hypothyroidism, unspecified: Secondary | ICD-10-CM | POA: Insufficient documentation

## 2018-09-27 DIAGNOSIS — Z79899 Other long term (current) drug therapy: Secondary | ICD-10-CM | POA: Insufficient documentation

## 2018-09-27 DIAGNOSIS — O98511 Other viral diseases complicating pregnancy, first trimester: Secondary | ICD-10-CM | POA: Diagnosis not present

## 2018-09-27 DIAGNOSIS — O99281 Endocrine, nutritional and metabolic diseases complicating pregnancy, first trimester: Secondary | ICD-10-CM | POA: Insufficient documentation

## 2018-09-27 DIAGNOSIS — Z3A01 Less than 8 weeks gestation of pregnancy: Secondary | ICD-10-CM | POA: Insufficient documentation

## 2018-09-27 DIAGNOSIS — O26891 Other specified pregnancy related conditions, first trimester: Secondary | ICD-10-CM | POA: Diagnosis present

## 2018-09-27 LAB — URINALYSIS, ROUTINE W REFLEX MICROSCOPIC
BILIRUBIN URINE: NEGATIVE
Glucose, UA: NEGATIVE mg/dL
Hgb urine dipstick: NEGATIVE
Ketones, ur: 5 mg/dL — AB
Nitrite: NEGATIVE
Protein, ur: NEGATIVE mg/dL
Specific Gravity, Urine: 1.023 (ref 1.005–1.030)
pH: 5 (ref 5.0–8.0)

## 2018-09-27 LAB — CBC WITH DIFFERENTIAL/PLATELET
Abs Immature Granulocytes: 0.03 10*3/uL (ref 0.00–0.07)
Basophils Absolute: 0.1 10*3/uL (ref 0.0–0.1)
Basophils Relative: 1 %
Eosinophils Absolute: 0.2 10*3/uL (ref 0.0–0.5)
Eosinophils Relative: 3 %
HCT: 41.7 % (ref 36.0–46.0)
Hemoglobin: 13.3 g/dL (ref 12.0–15.0)
Immature Granulocytes: 1 %
Lymphocytes Relative: 11 %
Lymphs Abs: 0.7 10*3/uL (ref 0.7–4.0)
MCH: 31.1 pg (ref 26.0–34.0)
MCHC: 31.9 g/dL (ref 30.0–36.0)
MCV: 97.7 fL (ref 80.0–100.0)
Monocytes Absolute: 0.5 10*3/uL (ref 0.1–1.0)
Monocytes Relative: 8 %
NRBC: 0 % (ref 0.0–0.2)
Neutro Abs: 5 10*3/uL (ref 1.7–7.7)
Neutrophils Relative %: 76 %
Platelets: 288 10*3/uL (ref 150–400)
RBC: 4.27 MIL/uL (ref 3.87–5.11)
RDW: 12.6 % (ref 11.5–15.5)
WBC: 6.5 10*3/uL (ref 4.0–10.5)

## 2018-09-27 LAB — PROTEIN / CREATININE RATIO, URINE
Creatinine, Urine: 234.24 mg/dL
Protein Creatinine Ratio: 0.06 mg/mg{Cre} (ref 0.00–0.15)
TOTAL PROTEIN, URINE: 15 mg/dL

## 2018-09-27 MED ORDER — ACETAMINOPHEN 500 MG PO TABS
1000.0000 mg | ORAL_TABLET | Freq: Once | ORAL | Status: AC
  Administered 2018-09-27: 1000 mg via ORAL
  Filled 2018-09-27: qty 2

## 2018-09-27 MED ORDER — DOXYLAMINE-PYRIDOXINE 10-10 MG PO TBEC: 1.0000 | DELAYED_RELEASE_TABLET | Freq: Three times a day (TID) | ORAL | 0 refills | Status: AC | PRN

## 2018-09-27 MED ORDER — ONDANSETRON HCL 4 MG/2ML IJ SOLN
4.0000 mg | Freq: Once | INTRAMUSCULAR | Status: AC
  Administered 2018-09-27: 4 mg via INTRAVENOUS
  Filled 2018-09-27: qty 2

## 2018-09-27 MED ORDER — SODIUM CHLORIDE 0.9 % IV BOLUS
500.0000 mL | Freq: Once | INTRAVENOUS | Status: AC
  Administered 2018-09-27: 500 mL via INTRAVENOUS

## 2018-09-27 NOTE — ED Provider Notes (Addendum)
[facility]-EMERGENCY DEPT Provider Note   CSN: [id number] Arrival date & time: 09/27/18  2132   History   Chief Complaint Chief Complaint  Patient presents with  . flu like symptoms    HPI Tammie Rodriguez is a 42 y.o. female.  This is a [redacted] week pregnant female presenting with a nasal congestion, and productive cough with clear sputum that started yesterday. Today she started having generralized body aches, nausea, nonbilious nonbloody vomiting, and some mild shortness of breath.  She denies any sore throat, sinus pressure, or headaches.  She has tried Tylenol today however reports that this did not help.  Her husband has had some congestion but she denies any other sick contacts.  She did have the flu in March or April of this year that lasted for about 1 week.  She does have a history of pneumonia about 15 years ago, but was never hospitalized for this.  She denies any smoking, alcohol, or drug use.  She has a history of miscarriages in the past and had 1 earlier this year, she is very concerned about her pregnancy, this one is a donor embryo.     Past Medical History:  Diagnosis Date  . Acid reflux   . ADHD (attention deficit hyperactivity disorder)   . Anxiety   . Bipolar disorder (HCC)   . Depression   . Obesity   . Reflux   . Thyroid disease     Patient Active Problem List   Diagnosis Date Noted  . Hypothyroidism 03/26/2014  . Anxiety state, unspecified 12/16/2013  . ADD (attention deficit disorder) 04/12/2013  . Morbid obesity (HCC) 04/12/2013  . Chronic insomnia 03/14/2013  . Depression 03/14/2013  . Hypothyroid 01/03/2012  . GERD (gastroesophageal reflux disease) 01/03/2012    Past Surgical History:  Procedure Laterality Date  . LIGAMENT REPAIR  2007  . THUMB ARTHROSCOPY       OB History    Gravida  1   Para      Term      Preterm      AB      Living        SAB      TAB      Ectopic      Multiple      Live  Births               Home Medications    Prior to Admission medications   Medication Sig Start Date End Date Taking? Authorizing Provider  acetaminophen (TYLENOL) 500 MG tablet Take 500 mg by mouth every 6 (six) hours as needed for moderate pain.   Yes [provider]  aspirin EC 81 MG tablet Take 81 mg by mouth daily.   Yes [provider]  estradiol (ESTRACE) 2 MG tablet Take 2 mg by mouth 2 (two) times daily.   Yes [provider]  folic acid (FOLVITE) 1 MG tablet Take 1 mg by mouth daily.   Yes [provider]  guaiFENesin-dextromethorphan (ROBITUSSIN DM) 100-10 MG/5ML syrup Take 5 mLs by mouth every 4 (four) hours as needed for cough.   Yes [provider]  levothyroxine (SYNTHROID, LEVOTHROID) 137 MCG tablet TAKE ONE TABLET BY MOUTH DAILY FOR THYROID 08/06/14  Yes Merlyn Albert, MD  loratadine (CLARITIN) 10 MG tablet Take 10 mg by mouth daily as needed for allergies.   Yes [provider]  Prenatal Vit-Fe Fumarate-FA (MULTIVITAMIN-PRENATAL) 27-0.8 MG TABS tablet Take 1 tablet by  mouth daily at 12 noon.   Yes [provider]  progesterone (PROMETRIUM) 200 MG capsule Take 200 mg by mouth daily.   Yes [provider]  ALPRAZolam Prudy Feeler) 1 MG tablet TAKE 1 TABLET BY MOUTH TWICE DAILY AS NEEDED FOR ANXIETY Patient not taking: Reported on 09/27/2018 12/29/14   Merlyn Albert, MD  buPROPion (WELLBUTRIN XL) 300 MG 24 hr tablet TAKE 1 TABLET BY MOUTH DAILY Patient not taking: Reported on 09/27/2018 08/06/14   Merlyn Albert, MD  dicyclomine (BENTYL) 10 MG capsule Take 1 capsule (10 mg total) by mouth 4 (four) times daily -  before meals and at bedtime. Prn abd spasms Patient not taking: Reported on 09/27/2018 08/30/13   Campbell Riches, NP  Doxylamine-Pyridoxine 10-10 MG TBEC Take 1 tablet by mouth every 8 (eight) hours as needed for up to 7 days (nausea and vomiting). 09/27/18 10/04/18  Long, Arlyss Repress, MD    HYDROcodone-acetaminophen (NORCO/VICODIN) 5-325 MG per tablet Take 1-2 tablets by mouth every 4 (four) hours as needed for moderate pain or severe pain. Patient not taking: Reported on 09/27/2018 10/17/14   Lurene Shadow, PA-C  lamoTRIgine (LAMICTAL) 100 MG tablet TAKE 1 TABLET BY MOUTH EVERY NIGHT AT BEDTIME Patient not taking: Reported on 09/27/2018 08/06/14   Merlyn Albert, MD  lisdexamfetamine (VYVANSE) 70 MG capsule Take 1 capsule (70 mg total) by mouth every morning. Patient not taking: Reported on 09/27/2018 08/06/14   Merlyn Albert, MD  meloxicam (MOBIC) 7.5 MG tablet Take 1 tablet (7.5 mg total) by mouth daily. Patient not taking: Reported on 09/27/2018 10/17/14   Lurene Shadow, PA-C  mupirocin ointment (BACTROBAN) 2 % Place 1 application into the nose 2 (two) times daily. Patient not taking: Reported on 09/27/2018 08/06/14   Merlyn Albert, MD  ondansetron (ZOFRAN-ODT) 8 MG disintegrating tablet Take 1 tablet (8 mg total) by mouth every 8 (eight) hours as needed for nausea. Patient not taking: Reported on 09/27/2018 10/10/14   Collene Gobble, MD  oseltamivir (TAMIFLU) 75 MG capsule Take 1 capsule (75 mg total) by mouth 2 (two) times daily for 10 days. 09/28/18 10/08/18  Claudean Severance, MD  pantoprazole (PROTONIX) 40 MG tablet Take 1 tablet (40 mg total) by mouth daily. Prn abd pain or acid reflux Patient not taking: Reported on 09/27/2018 08/06/14   Merlyn Albert, MD  zolpidem (AMBIEN) 10 MG tablet TAKE 1 TABLET BY MOUTH EVERY NIGHT AT BEDTIME AS NEEDED FOR SLEEP Patient not taking: Reported on 09/27/2018 12/29/14   Merlyn Albert, MD    Family History Family History  Problem Relation Age of Onset  . Heart failure Maternal Grandmother   . Diabetes Maternal Grandmother   . Hypertension Mother     Social History Social History   Tobacco Use  . Smoking status: Never Smoker  Substance Use Topics  . Alcohol use: Yes  . Drug use: No     Allergies   Augmentin  [amoxicillin-pot clavulanate]; Adderall xr [amphetamine-dextroamphet er]; Amoxicillin; and Penicillins   Review of Systems Review of Systems  Constitutional: Positive for appetite change, fatigue and fever.  HENT: Negative for sinus pressure, sinus pain, sore throat and trouble swallowing.   Respiratory: Positive for cough and shortness of breath. Negative for chest tightness.   Cardiovascular: Negative for chest pain and palpitations.  Gastrointestinal: Positive for nausea and vomiting. Negative for abdominal pain and diarrhea.  All other systems reviewed and are negative.    Physical  Exam Updated Vital Signs BP 123/67 (BP Location: Left Arm)   Pulse (!) 111   Temp (!) 101.7 F (38.7 C) (Oral)   Resp 20   Ht 5\' 9"  (1.753 m)   Wt 124.7 kg   SpO2 100%   BMI 40.61 kg/m   Physical Exam  Constitutional: She is oriented to person, place, and time. She appears well-developed and well-nourished. She appears distressed.  HENT:  Head: Normocephalic and atraumatic.  Mouth/Throat: Oropharynx is clear and moist. No oropharyngeal exudate.  Eyes: Pupils are equal, round, and reactive to light. EOM are normal.  Neck: Normal range of motion. Neck supple.  Cardiovascular: Normal rate, regular rhythm and normal heart sounds.  Pulmonary/Chest: Effort normal and breath sounds normal. No respiratory distress.  Abdominal: Soft. Bowel sounds are normal. There is no tenderness.  Gravid  Neurological: She is alert and oriented to person, place, and time.  Skin: Skin is warm. Capillary refill takes less than 2 seconds. She is not diaphoretic.  Psychiatric: Judgment normal.     ED Treatments / Results  Labs (all labs ordered are listed, but only abnormal results are displayed) Labs Reviewed  INFLUENZA PANEL BY PCR (TYPE A & B) - Abnormal; Notable for the following components:      Result Value   Influenza A By PCR POSITIVE (*)    All other components within normal limits  URINALYSIS, ROUTINE  W REFLEX MICROSCOPIC - Abnormal; Notable for the following components:   Ketones, ur 5 (*)    Leukocytes, UA TRACE (*)    Bacteria, UA RARE (*)    All other components within normal limits  COMPREHENSIVE METABOLIC PANEL - Abnormal; Notable for the following components:   Glucose, Bld 114 (*)    All other components within normal limits  CBC WITH DIFFERENTIAL/PLATELET  PROTEIN / CREATININE RATIO, URINE    EKG None  Radiology Dg Chest 2 View  Result Date: 09/27/2018 CLINICAL DATA:  Cough, fever EXAM: CHEST - 2 VIEW COMPARISON:  08/27/2014 FINDINGS: Heart and mediastinal contours are within normal limits. No focal opacities or effusions. No acute bony abnormality. IMPRESSION: No active cardiopulmonary disease. Electronically Signed   By: Charlett NoseKevin  Dover M.D.   On: 09/27/2018 22:46    Procedures Procedures (including critical care time)  Medications Ordered in ED Medications  acetaminophen (TYLENOL) tablet 1,000 mg (1,000 mg Oral Given 09/27/18 2324)  sodium chloride 0.9 % bolus 500 mL (0 mLs Intravenous Stopped 09/28/18 0020)  ondansetron (ZOFRAN) injection 4 mg (4 mg Intravenous Given 09/27/18 2324)     Initial Impression / Assessment and Plan / ED Course  I have reviewed the triage vital signs and the nursing notes.  Pertinent labs & imaging results that were available during my care of the patient were reviewed by me and considered in my medical decision making (see chart for details).      This is a 42 year old female who is [redacted] weeks pregnant presenting with a 1-day history of fevers, chills, productive cough, body aches, and some non-bilious, nonbloody vomiting.  She has a history of flu pneumonia.  She is found to be febrile to 101.7, tachycardia to 126, and hypertensive to 150/93.  Given her history of pregnancy and new onset of hypertension we will assess for pre-eclampsia. Total urine protein 15, urine creatinine 234, protein/cr ration 0.06. Chest x-ray showed no acute  abnormalities. CBC and CMP showed no acute abnormalities. Flu test came back positive for influenza A. She was prescribed tamiflu and diclegis.  Advised her to contact her OB-GYN and her PCP.   Final Clinical Impressions(s) / ED Diagnoses   Final diagnoses:  Influenza A    ED Discharge Orders         Ordered    oseltamivir (TAMIFLU) 75 MG capsule  2 times daily     09/28/18 0016    Doxylamine-Pyridoxine 10-10 MG TBEC  Every 8 hours PRN     09/27/18 2357           Claudean Severance, MD 09/28/18 0015    Claudean Severance, MD 09/28/18 Lambert Keto, MD 09/28/18 [phone]

## 2018-09-27 NOTE — Discharge Instructions (Addendum)
Ms. Tammie Rodriguez, I am sorry that your are feeling poorly. It looks like you are positive for the flu. Please start taking tamiflu and the diclegis.  Your blood pressure labs look good. Please call your OB-GYN tomorrow to follow up.

## 2018-09-27 NOTE — ED Triage Notes (Signed)
Pt to ED with c/o of flu like symptoms. Pt states she started feeling achy and not herself yesterday morning. Today she woke up feeling chills, body aches, coughing, emesis and nasal congestion. Pt is [redacted] weeks pregnant and called her OB whom told her to take tylenol 4 hours ago. Pt states she hasn't felt any better since taking the medicine. Pt is A&O x4 and ambulatory.

## 2018-09-28 LAB — COMPREHENSIVE METABOLIC PANEL
ALT: 29 U/L (ref 0–44)
AST: 25 U/L (ref 15–41)
Albumin: 3.9 g/dL (ref 3.5–5.0)
Alkaline Phosphatase: 97 U/L (ref 38–126)
Anion gap: 9 (ref 5–15)
BUN: 9 mg/dL (ref 6–20)
CHLORIDE: 104 mmol/L (ref 98–111)
CO2: 23 mmol/L (ref 22–32)
Calcium: 9.1 mg/dL (ref 8.9–10.3)
Creatinine, Ser: 0.73 mg/dL (ref 0.44–1.00)
GFR calc Af Amer: >60 mL/min (ref >60)
GFR calc non Af Amer: >60 mL/min (ref >60)
Glucose, Bld: 114 mg/dL — ABNORMAL HIGH (ref 70–99)
Potassium: 3.5 mmol/L (ref 3.5–5.1)
Sodium: 136 mmol/L (ref 135–145)
Total Bilirubin: 0.5 mg/dL (ref 0.3–1.2)
Total Protein: 7.8 g/dL (ref 6.5–8.1)

## 2018-09-28 LAB — INFLUENZA PANEL BY PCR (TYPE A & B)
INFLBPCR: NEGATIVE
Influenza A By PCR: POSITIVE — AB

## 2018-09-28 MED ORDER — OSELTAMIVIR PHOSPHATE 75 MG PO CAPS: 75.0000 mg | ORAL_CAPSULE | Freq: Two times a day (BID) | ORAL | 0 refills | Status: AC

## 2018-09-28 NOTE — ED Notes (Signed)
Topaz in room is not working so not able to obtain signature. Patient verbalized permission for this RN to sign for discharge.

## 2019-11-25 ENCOUNTER — Encounter: Payer: Self-pay | Admitting: Family Medicine
